# Patient Record
Sex: Female | Born: 1935 | Race: White | Hispanic: No | Smoking: Never smoker
Health system: Southern US, Community
[De-identification: ages and names within clinical notes are randomized; demographics above are authoritative.]

## PROBLEM LIST (undated history)

## (undated) DIAGNOSIS — F329 Major depressive disorder, single episode, unspecified: Secondary | ICD-10-CM

## (undated) DIAGNOSIS — R1310 Dysphagia, unspecified: Secondary | ICD-10-CM

## (undated) DIAGNOSIS — E785 Hyperlipidemia, unspecified: Secondary | ICD-10-CM

## (undated) DIAGNOSIS — K219 Gastro-esophageal reflux disease without esophagitis: Secondary | ICD-10-CM

## (undated) DIAGNOSIS — R918 Other nonspecific abnormal finding of lung field: Secondary | ICD-10-CM

## (undated) DIAGNOSIS — I10 Essential (primary) hypertension: Secondary | ICD-10-CM

## (undated) DIAGNOSIS — F32A Depression, unspecified: Secondary | ICD-10-CM

## (undated) DIAGNOSIS — F419 Anxiety disorder, unspecified: Secondary | ICD-10-CM

## (undated) HISTORY — DX: Gastro-esophageal reflux disease without esophagitis: K21.9

## (undated) HISTORY — PX: COLONOSCOPY: SHX174

## (undated) HISTORY — PX: LUMBAR LAMINECTOMY: SHX95

## (undated) HISTORY — DX: Hyperlipidemia, unspecified: E78.5

## (undated) HISTORY — DX: Major depressive disorder, single episode, unspecified: F32.9

## (undated) HISTORY — DX: Anxiety disorder, unspecified: F41.9

## (undated) HISTORY — PX: APPENDECTOMY: SHX54

## (undated) HISTORY — DX: Depression, unspecified: F32.A

## (undated) HISTORY — DX: Essential (primary) hypertension: I10

---

## 2001-04-03 ENCOUNTER — Encounter: Admission: RE | Admit: 2001-04-03 | Discharge: 2001-04-03 | Payer: Self-pay | Admitting: Family Medicine

## 2001-04-03 ENCOUNTER — Encounter: Payer: Self-pay | Admitting: Family Medicine

## 2001-05-29 ENCOUNTER — Other Ambulatory Visit: Admission: RE | Admit: 2001-05-29 | Discharge: 2001-05-29 | Payer: Self-pay | Admitting: Family Medicine

## 2002-03-05 ENCOUNTER — Encounter: Admission: RE | Admit: 2002-03-05 | Discharge: 2002-04-09 | Payer: Self-pay | Admitting: Orthopedic Surgery

## 2002-09-05 ENCOUNTER — Emergency Department (HOSPITAL_COMMUNITY): Admission: EM | Admit: 2002-09-05 | Discharge: 2002-09-05 | Payer: Self-pay | Admitting: Emergency Medicine

## 2002-09-05 ENCOUNTER — Encounter: Payer: Self-pay | Admitting: Emergency Medicine

## 2002-09-15 ENCOUNTER — Emergency Department (HOSPITAL_COMMUNITY): Admission: EM | Admit: 2002-09-15 | Discharge: 2002-09-15 | Payer: Self-pay | Admitting: Emergency Medicine

## 2002-09-15 ENCOUNTER — Encounter: Payer: Self-pay | Admitting: Emergency Medicine

## 2003-02-20 ENCOUNTER — Inpatient Hospital Stay (HOSPITAL_COMMUNITY): Admission: RE | Admit: 2003-02-20 | Discharge: 2003-02-22 | Payer: Self-pay | Admitting: Orthopedic Surgery

## 2003-02-25 ENCOUNTER — Emergency Department (HOSPITAL_COMMUNITY): Admission: EM | Admit: 2003-02-25 | Discharge: 2003-02-25 | Payer: Self-pay | Admitting: Emergency Medicine

## 2003-05-19 ENCOUNTER — Encounter: Admission: RE | Admit: 2003-05-19 | Discharge: 2003-06-30 | Payer: Self-pay | Admitting: Orthopedic Surgery

## 2004-07-08 ENCOUNTER — Other Ambulatory Visit: Admission: RE | Admit: 2004-07-08 | Discharge: 2004-07-08 | Payer: Self-pay | Admitting: Family Medicine

## 2004-08-30 ENCOUNTER — Ambulatory Visit (HOSPITAL_COMMUNITY): Admission: RE | Admit: 2004-08-30 | Discharge: 2004-08-30 | Payer: Self-pay | Admitting: Gastroenterology

## 2005-07-19 ENCOUNTER — Ambulatory Visit: Payer: Self-pay | Admitting: Family Medicine

## 2005-10-25 ENCOUNTER — Ambulatory Visit: Payer: Self-pay | Admitting: Family Medicine

## 2005-11-14 ENCOUNTER — Ambulatory Visit: Payer: Self-pay | Admitting: Cardiology

## 2005-11-30 ENCOUNTER — Ambulatory Visit: Payer: Self-pay | Admitting: Family Medicine

## 2006-01-19 ENCOUNTER — Ambulatory Visit: Payer: Self-pay | Admitting: Family Medicine

## 2006-06-23 DIAGNOSIS — M810 Age-related osteoporosis without current pathological fracture: Secondary | ICD-10-CM | POA: Insufficient documentation

## 2006-06-23 DIAGNOSIS — F411 Generalized anxiety disorder: Secondary | ICD-10-CM | POA: Insufficient documentation

## 2006-06-23 DIAGNOSIS — Z8679 Personal history of other diseases of the circulatory system: Secondary | ICD-10-CM | POA: Insufficient documentation

## 2006-06-23 DIAGNOSIS — F329 Major depressive disorder, single episode, unspecified: Secondary | ICD-10-CM

## 2006-06-27 ENCOUNTER — Ambulatory Visit: Payer: Self-pay | Admitting: Family Medicine

## 2006-06-27 LAB — CONVERTED CEMR LAB
AST: 28 units/L (ref 0–37)
Albumin: 4 g/dL (ref 3.5–5.2)
Alkaline Phosphatase: 40 units/L (ref 39–117)
CO2: 31 meq/L (ref 19–32)
Chloride: 100 meq/L (ref 96–112)
Creatinine, Ser: 0.7 mg/dL (ref 0.4–1.2)
Potassium: 4.3 meq/L (ref 3.5–5.1)
Sodium: 136 meq/L (ref 135–145)
Total Bilirubin: 0.6 mg/dL (ref 0.3–1.2)
Total Protein: 7 g/dL (ref 6.0–8.3)

## 2006-08-17 ENCOUNTER — Ambulatory Visit: Payer: Self-pay | Admitting: Family Medicine

## 2006-08-17 DIAGNOSIS — H6590 Unspecified nonsuppurative otitis media, unspecified ear: Secondary | ICD-10-CM | POA: Insufficient documentation

## 2006-08-28 ENCOUNTER — Encounter: Payer: Self-pay | Admitting: Family Medicine

## 2006-09-02 ENCOUNTER — Encounter: Payer: Self-pay | Admitting: Family Medicine

## 2006-09-04 ENCOUNTER — Encounter (INDEPENDENT_AMBULATORY_CARE_PROVIDER_SITE_OTHER): Payer: Self-pay | Admitting: *Deleted

## 2006-09-07 ENCOUNTER — Encounter: Payer: Self-pay | Admitting: Family Medicine

## 2006-09-12 ENCOUNTER — Telehealth (INDEPENDENT_AMBULATORY_CARE_PROVIDER_SITE_OTHER): Payer: Self-pay | Admitting: *Deleted

## 2006-09-18 ENCOUNTER — Telehealth (INDEPENDENT_AMBULATORY_CARE_PROVIDER_SITE_OTHER): Payer: Self-pay | Admitting: *Deleted

## 2006-09-25 ENCOUNTER — Other Ambulatory Visit: Admission: RE | Admit: 2006-09-25 | Discharge: 2006-09-25 | Payer: Self-pay | Admitting: Family Medicine

## 2006-09-25 ENCOUNTER — Encounter: Payer: Self-pay | Admitting: Family Medicine

## 2006-09-25 ENCOUNTER — Ambulatory Visit: Payer: Self-pay | Admitting: Family Medicine

## 2006-09-25 DIAGNOSIS — E785 Hyperlipidemia, unspecified: Secondary | ICD-10-CM

## 2006-09-25 DIAGNOSIS — H9190 Unspecified hearing loss, unspecified ear: Secondary | ICD-10-CM | POA: Insufficient documentation

## 2006-09-26 ENCOUNTER — Encounter: Payer: Self-pay | Admitting: Family Medicine

## 2006-09-28 ENCOUNTER — Encounter (INDEPENDENT_AMBULATORY_CARE_PROVIDER_SITE_OTHER): Payer: Self-pay | Admitting: *Deleted

## 2006-10-12 ENCOUNTER — Telehealth: Payer: Self-pay | Admitting: Family Medicine

## 2006-11-21 ENCOUNTER — Encounter: Payer: Self-pay | Admitting: Family Medicine

## 2006-11-22 ENCOUNTER — Telehealth: Payer: Self-pay | Admitting: Family Medicine

## 2007-01-03 ENCOUNTER — Telehealth (INDEPENDENT_AMBULATORY_CARE_PROVIDER_SITE_OTHER): Payer: Self-pay | Admitting: *Deleted

## 2007-01-03 ENCOUNTER — Ambulatory Visit: Payer: Self-pay | Admitting: Family Medicine

## 2007-01-25 ENCOUNTER — Telehealth (INDEPENDENT_AMBULATORY_CARE_PROVIDER_SITE_OTHER): Payer: Self-pay | Admitting: *Deleted

## 2007-04-19 ENCOUNTER — Telehealth (INDEPENDENT_AMBULATORY_CARE_PROVIDER_SITE_OTHER): Payer: Self-pay | Admitting: *Deleted

## 2007-06-08 ENCOUNTER — Telehealth (INDEPENDENT_AMBULATORY_CARE_PROVIDER_SITE_OTHER): Payer: Self-pay | Admitting: *Deleted

## 2007-06-14 ENCOUNTER — Ambulatory Visit: Payer: Self-pay | Admitting: Family Medicine

## 2007-06-14 DIAGNOSIS — I10 Essential (primary) hypertension: Secondary | ICD-10-CM | POA: Insufficient documentation

## 2007-06-14 DIAGNOSIS — M1990 Unspecified osteoarthritis, unspecified site: Secondary | ICD-10-CM | POA: Insufficient documentation

## 2007-06-15 ENCOUNTER — Encounter (INDEPENDENT_AMBULATORY_CARE_PROVIDER_SITE_OTHER): Payer: Self-pay | Admitting: *Deleted

## 2007-06-19 ENCOUNTER — Encounter (INDEPENDENT_AMBULATORY_CARE_PROVIDER_SITE_OTHER): Payer: Self-pay | Admitting: *Deleted

## 2007-08-14 ENCOUNTER — Encounter (INDEPENDENT_AMBULATORY_CARE_PROVIDER_SITE_OTHER): Payer: Self-pay | Admitting: *Deleted

## 2007-08-16 ENCOUNTER — Telehealth: Payer: Self-pay | Admitting: Family Medicine

## 2007-08-30 ENCOUNTER — Telehealth (INDEPENDENT_AMBULATORY_CARE_PROVIDER_SITE_OTHER): Payer: Self-pay | Admitting: *Deleted

## 2007-12-11 ENCOUNTER — Ambulatory Visit: Payer: Self-pay | Admitting: Family Medicine

## 2008-01-11 ENCOUNTER — Encounter: Payer: Self-pay | Admitting: Family Medicine

## 2008-01-25 ENCOUNTER — Ambulatory Visit: Payer: Self-pay | Admitting: Family Medicine

## 2008-02-04 ENCOUNTER — Encounter (INDEPENDENT_AMBULATORY_CARE_PROVIDER_SITE_OTHER): Payer: Self-pay | Admitting: *Deleted

## 2008-02-05 ENCOUNTER — Telehealth (INDEPENDENT_AMBULATORY_CARE_PROVIDER_SITE_OTHER): Payer: Self-pay | Admitting: *Deleted

## 2008-02-07 ENCOUNTER — Ambulatory Visit: Payer: Self-pay | Admitting: Family Medicine

## 2008-02-11 ENCOUNTER — Encounter (INDEPENDENT_AMBULATORY_CARE_PROVIDER_SITE_OTHER): Payer: Self-pay | Admitting: *Deleted

## 2008-02-20 ENCOUNTER — Encounter (INDEPENDENT_AMBULATORY_CARE_PROVIDER_SITE_OTHER): Payer: Self-pay | Admitting: *Deleted

## 2008-03-20 ENCOUNTER — Telehealth (INDEPENDENT_AMBULATORY_CARE_PROVIDER_SITE_OTHER): Payer: Self-pay | Admitting: *Deleted

## 2008-05-21 ENCOUNTER — Encounter: Payer: Self-pay | Admitting: Family Medicine

## 2008-05-28 ENCOUNTER — Encounter: Payer: Self-pay | Admitting: Family Medicine

## 2008-07-14 ENCOUNTER — Ambulatory Visit: Payer: Self-pay | Admitting: Family Medicine

## 2008-07-14 DIAGNOSIS — J069 Acute upper respiratory infection, unspecified: Secondary | ICD-10-CM | POA: Insufficient documentation

## 2008-12-04 ENCOUNTER — Encounter: Payer: Self-pay | Admitting: Family Medicine

## 2009-01-12 ENCOUNTER — Encounter: Payer: Self-pay | Admitting: Family Medicine

## 2009-01-26 ENCOUNTER — Ambulatory Visit: Payer: Self-pay | Admitting: Family Medicine

## 2009-01-26 DIAGNOSIS — R5383 Other fatigue: Secondary | ICD-10-CM

## 2009-01-26 DIAGNOSIS — R5381 Other malaise: Secondary | ICD-10-CM

## 2009-01-27 ENCOUNTER — Telehealth: Payer: Self-pay | Admitting: Family Medicine

## 2009-01-28 ENCOUNTER — Encounter (INDEPENDENT_AMBULATORY_CARE_PROVIDER_SITE_OTHER): Payer: Self-pay | Admitting: *Deleted

## 2009-01-30 ENCOUNTER — Telehealth (INDEPENDENT_AMBULATORY_CARE_PROVIDER_SITE_OTHER): Payer: Self-pay | Admitting: *Deleted

## 2009-02-17 ENCOUNTER — Telehealth (INDEPENDENT_AMBULATORY_CARE_PROVIDER_SITE_OTHER): Payer: Self-pay | Admitting: *Deleted

## 2009-03-11 ENCOUNTER — Ambulatory Visit: Payer: Self-pay | Admitting: Family Medicine

## 2009-05-20 ENCOUNTER — Telehealth: Payer: Self-pay | Admitting: Family Medicine

## 2009-05-27 ENCOUNTER — Ambulatory Visit: Payer: Self-pay | Admitting: Family Medicine

## 2009-06-09 ENCOUNTER — Telehealth (INDEPENDENT_AMBULATORY_CARE_PROVIDER_SITE_OTHER): Payer: Self-pay | Admitting: *Deleted

## 2009-06-11 ENCOUNTER — Encounter: Payer: Self-pay | Admitting: Family Medicine

## 2009-06-15 ENCOUNTER — Telehealth (INDEPENDENT_AMBULATORY_CARE_PROVIDER_SITE_OTHER): Payer: Self-pay | Admitting: *Deleted

## 2009-09-07 ENCOUNTER — Ambulatory Visit: Payer: Self-pay | Admitting: Family Medicine

## 2009-10-14 ENCOUNTER — Telehealth: Payer: Self-pay | Admitting: Family Medicine

## 2009-12-08 ENCOUNTER — Ambulatory Visit: Payer: Self-pay | Admitting: Family Medicine

## 2010-01-07 ENCOUNTER — Telehealth: Payer: Self-pay | Admitting: Family Medicine

## 2010-01-13 ENCOUNTER — Encounter: Payer: Self-pay | Admitting: Family Medicine

## 2010-01-29 ENCOUNTER — Encounter: Payer: Self-pay | Admitting: Family Medicine

## 2010-01-29 ENCOUNTER — Ambulatory Visit: Payer: Self-pay | Admitting: Family Medicine

## 2010-01-29 LAB — CONVERTED CEMR LAB
Bilirubin Urine: NEGATIVE
Ketones, urine, test strip: NEGATIVE
Nitrite: NEGATIVE
Specific Gravity, Urine: <1.005

## 2010-02-01 LAB — CONVERTED CEMR LAB
ALT: 24 units/L (ref 0–35)
Albumin: 4.4 g/dL (ref 3.5–5.2)
Basophils Relative: 0.8 % (ref 0.0–3.0)
CO2: 28 meq/L (ref 19–32)
Chloride: 100 meq/L (ref 96–112)
Creatinine, Ser: 0.7 mg/dL (ref 0.4–1.2)
Eosinophils Absolute: 0.4 10*3/uL (ref 0.0–0.7)
HCT: 38 % (ref 36.0–46.0)
Hemoglobin: 13.2 g/dL (ref 12.0–15.0)
Lymphs Abs: 2 10*3/uL (ref 0.7–4.0)
MCHC: 34.8 g/dL (ref 30.0–36.0)
MCV: 96 fL (ref 78.0–100.0)
Monocytes Absolute: 0.6 10*3/uL (ref 0.1–1.0)
Neutro Abs: 2.7 10*3/uL (ref 1.4–7.7)
Neutrophils Relative %: 47 % (ref 43.0–77.0)
Potassium: 4.7 meq/L (ref 3.5–5.1)
RBC: 3.96 M/uL (ref 3.87–5.11)
Total CHOL/HDL Ratio: 3
Total Protein: 7.2 g/dL (ref 6.0–8.3)
Triglycerides: 130 mg/dL (ref 0.0–149.0)
Vit D, 25-Hydroxy: 60 ng/mL (ref 30–89)

## 2010-02-05 ENCOUNTER — Telehealth: Payer: Self-pay | Admitting: Family Medicine

## 2010-02-19 ENCOUNTER — Encounter: Payer: Self-pay | Admitting: Family Medicine

## 2010-02-25 ENCOUNTER — Encounter (INDEPENDENT_AMBULATORY_CARE_PROVIDER_SITE_OTHER): Payer: Self-pay | Admitting: *Deleted

## 2010-03-05 ENCOUNTER — Telehealth: Payer: Self-pay | Admitting: Family Medicine

## 2010-03-11 ENCOUNTER — Ambulatory Visit: Admit: 2010-03-11 | Payer: Self-pay | Admitting: Family Medicine

## 2010-03-21 LAB — CONVERTED CEMR LAB
ALT: 17 units/L (ref 0–35)
ALT: 24 units/L (ref 0–35)
ALT: 24 units/L (ref 0–35)
ALT: 25 units/L (ref 0–35)
AST: 25 units/L (ref 0–37)
AST: 25 units/L (ref 0–37)
AST: 29 units/L (ref 0–37)
Albumin: 3.9 g/dL (ref 3.5–5.2)
Albumin: 4 g/dL (ref 3.5–5.2)
Albumin: 4.1 g/dL (ref 3.5–5.2)
Albumin: 4.3 g/dL (ref 3.5–5.2)
Albumin: 4.5 g/dL (ref 3.5–5.2)
Alkaline Phosphatase: 43 units/L (ref 39–117)
Alkaline Phosphatase: 43 units/L (ref 39–117)
Alkaline Phosphatase: 53 units/L (ref 39–117)
BUN: 9 mg/dL (ref 6–23)
Basophils Absolute: 0 10*3/uL (ref 0.0–0.1)
Basophils Relative: 0.5 % (ref 0.0–1.0)
Basophils Relative: 0.5 % (ref 0.0–3.0)
Basophils Relative: 0.6 % (ref 0.0–3.0)
Bilirubin Urine: NEGATIVE
Bilirubin, Direct: 0 mg/dL (ref 0.0–0.3)
Bilirubin, Direct: 0.1 mg/dL (ref 0.0–0.3)
Bilirubin, Direct: 0.1 mg/dL (ref 0.0–0.3)
CO2: 29 meq/L (ref 19–32)
CO2: 29 meq/L (ref 19–32)
CO2: 30 meq/L (ref 19–32)
Calcium: 8.6 mg/dL (ref 8.4–10.5)
Calcium: 9.1 mg/dL (ref 8.4–10.5)
Calcium: 9.2 mg/dL (ref 8.4–10.5)
Calcium: 9.3 mg/dL (ref 8.4–10.5)
Chloride: 102 meq/L (ref 96–112)
Chloride: 98 meq/L (ref 96–112)
Creatinine, Ser: 0.5 mg/dL (ref 0.4–1.2)
Creatinine, Ser: 0.6 mg/dL (ref 0.4–1.2)
Eosinophils Absolute: 0.2 10*3/uL (ref 0.0–0.7)
GFR calc Af Amer: 125 mL/min
GFR calc non Af Amer: 104 mL/min
Glucose, Bld: 73 mg/dL (ref 70–99)
Glucose, Bld: 84 mg/dL (ref 70–99)
Glucose, Urine, Semiquant: NEGATIVE
HCT: 37.8 % (ref 36.0–46.0)
Hemoglobin: 12.3 g/dL (ref 12.0–15.0)
Hemoglobin: 13.2 g/dL (ref 12.0–15.0)
Hemoglobin: 13.4 g/dL (ref 12.0–15.0)
Lymphocytes Relative: 31.7 % (ref 12.0–46.0)
Lymphocytes Relative: 34.2 % (ref 12.0–46.0)
MCHC: 33.7 g/dL (ref 30.0–36.0)
MCHC: 34.8 g/dL (ref 30.0–36.0)
MCHC: 34.9 g/dL (ref 30.0–36.0)
MCV: 99.1 fL (ref 78.0–100.0)
Monocytes Absolute: 0.5 10*3/uL (ref 0.2–0.7)
Monocytes Absolute: 0.6 10*3/uL (ref 0.1–1.0)
Monocytes Relative: 10.1 % (ref 3.0–12.0)
Monocytes Relative: 9 % (ref 3.0–11.0)
Neutro Abs: 3.1 10*3/uL (ref 1.4–7.7)
Neutro Abs: 3.2 10*3/uL (ref 1.4–7.7)
Potassium: 3.6 meq/L (ref 3.5–5.1)
Potassium: 4.8 meq/L (ref 3.5–5.1)
Protein, U semiquant: NEGATIVE
RBC: 3.76 M/uL — ABNORMAL LOW (ref 3.87–5.11)
RBC: 3.95 M/uL (ref 3.87–5.11)
RBC: 4.02 M/uL (ref 3.87–5.11)
RDW: 12.4 % (ref 11.5–14.6)
Sed Rate: 18 mm/hr (ref 0–22)
Sodium: 133 meq/L — ABNORMAL LOW (ref 135–145)
Sodium: 134 meq/L — ABNORMAL LOW (ref 135–145)
Specific Gravity, Urine: <1.005
TSH: 2.21 microintl units/mL (ref 0.35–5.50)
Total Bilirubin: 0.4 mg/dL (ref 0.3–1.2)
Total Bilirubin: 0.6 mg/dL (ref 0.3–1.2)
Total Bilirubin: 0.7 mg/dL (ref 0.3–1.2)
Total CHOL/HDL Ratio: 5
Total Protein: 6.9 g/dL (ref 6.0–8.3)
Total Protein: 7.1 g/dL (ref 6.0–8.3)
Total Protein: 7.2 g/dL (ref 6.0–8.3)
Total Protein: 7.3 g/dL (ref 6.0–8.3)
Total Protein: 7.3 g/dL (ref 6.0–8.3)
Total Protein: 7.5 g/dL (ref 6.0–8.3)
Urobilinogen, UA: NEGATIVE
Vit D, 1,25-Dihydroxy: 58 (ref 30–89)
WBC Urine, dipstick: NEGATIVE
pH: 8.5

## 2010-03-24 ENCOUNTER — Encounter: Payer: Self-pay | Admitting: Family Medicine

## 2010-03-24 ENCOUNTER — Ambulatory Visit (INDEPENDENT_AMBULATORY_CARE_PROVIDER_SITE_OTHER): Payer: BC Managed Care – PPO | Admitting: Family Medicine

## 2010-03-24 ENCOUNTER — Ambulatory Visit: Admit: 2010-03-24 | Payer: Self-pay | Admitting: Family Medicine

## 2010-03-24 DIAGNOSIS — M810 Age-related osteoporosis without current pathological fracture: Secondary | ICD-10-CM

## 2010-03-25 NOTE — Progress Notes (Signed)
Summary: never started med  Phone Note Call from Patient   Caller: Patient Summary of Call:  Pt states that after reading labs results she realized that she never started taking LIVALO 2mg  due to the cost. pt states that she has currently been taking pravastatin 80mg  once daily. pt would like to know if she needs to continue. with this med or change. However pt states that this is the lowest her cholesterol has been and would like to continue with this med........................Marland KitchenFelecia Deloach CMA  June 15, 2009 8:41 AM   Follow-up for Phone Call        ok to con't pravachol-- but needs something to lower TG and SMall LDL----  We can try to increase Fish Oil / Flaxseed oil to 4 g daily divided---do not take it all at once---and recheck 3 months Follow-up by: Loreen Freud DO,  June 15, 2009 12:54 PM  Additional Follow-up for Phone Call Additional follow up Details #1::        left message to call office..............Marland KitchenFelecia Deloach CMA  June 15, 2009 4:08 PM   pt aware rx sent to pharmacy. pt  will try flaxseed oil due to acid reflux unable to tolerate fish oil..........Marland KitchenFelecia Deloach CMA  June 15, 2009 4:43 PM     New/Updated Medications: PRAVACHOL 80 MG TABS (PRAVASTATIN SODIUM) take 1 tab once daily Prescriptions: PRAVACHOL 80 MG TABS (PRAVASTATIN SODIUM) take 1 tab once daily  #90 x 0   Entered by:   Jeremy Johann CMA   Authorized by:   Loreen Freud DO   Signed by:   Jeremy Johann CMA on 06/15/2009   Method used:   Faxed to ...       [facility] (mail-order)             ,          Ph: [phone]       Fax: [phone]   RxID:   [id number]

## 2010-03-25 NOTE — Assessment & Plan Note (Signed)
Summary: EMP//PH   Vital Signs:  Patient profile:   77 year old female Height:      59 inches Weight:      112.6 pounds BMI:     22.82 Temp:     97.3 degrees F oral BP sitting:   120 / 80  (right arm) Cuff size:   regular  Vitals Entered By: Almeta Monas CMA Duncan Dull) (January 29, 2010 10:00 AM) CC: CPX/fasting- c/o neck   Does patient need assistance? Functional Status Self care, Cook/clean, Shopping, Social activities Ambulation Normal Comments pt is able todo all ADLS and can read and write  Vision Screening:      Vision Comments: optho--regularly--1-2 x a year--- Dr Elmer Picker 40db HL: Left  Right  Audiometry Comment: pt has hearing aids.    History of Present Illness: Pt here for cpe and labs--- no pap.   dr Gwendolyn Grant-- dentist optho--Dr Elmer Picker  Preventive Screening-Counseling & Management  Alcohol-Tobacco     Alcohol drinks/day: 0     Smoking Status: never     Passive Smoke Exposure: no  Caffeine-Diet-Exercise     Caffeine use/day: 0     Does Patient Exercise: yes     Type of exercise: walk      Exercise (avg: min/session): <30     Times/week: 3  Hep-HIV-STD-Contraception     HIV Risk: no     Dental Visit-last 6 months yes     Dental Care Counseling: not indicated; dental care within six months     SBE monthly: no     SBE Education/Counseling: to perform regular SBE  Safety-Violence-Falls     Seat Belt Use: 100     Firearms in the Home: no firearms in the home     Smoke Detectors: yes     Violence in the Home: no risk noted     Sexual Abuse: no     Fall Risk: no  Current Medications (verified): 1)  Xanax 0.5 Mg Tabs (Alprazolam) .... Take One Tablet Daily 2)  Impramine 75 Mg .Marland Kitchen.. 1 Once Daily 3)  Atenolol 25 Mg  Tabs (Atenolol) .Marland Kitchen.. 1 By Mouth Once Daily 4)  Ambien 10 Mg  Tabs (Zolpidem Tartrate) .... 1/2 Tab 3 X Every Week 5)  Fish Oil 1200 Mg  Caps (Omega-3 Fatty Acids) .Marland Kitchen.. 1 Once Daily 6)  Calcium .Marland KitchenMarland Kitchen. 1800 Mg Dailky 7)  Adult Aspirin Low  Strength 81 Mg  Tbdp (Aspirin) .Marland Kitchen.. 1 By Mouth Once Daily 8)  Zostavax 95621 Unt/0.64ml Solr (Zoster Vaccine Live) .Marland Kitchen.. 1 Ml Im X1 9)  Claritin 10 Mg Tabs (Loratadine) .Marland Kitchen.. 1 By Mouth Once Daily 10)  Vitamin D3 1000 Unit Caps (Cholecalciferol) .... 3 A Day 11)  Bd Insulin Syringe Ultrafine 29g X 1/2" 0.3 Ml Misc (Insulin Syringe-Needle U-100) .... As Directed 12)  Celebrex 200 Mg Caps (Celecoxib) .Marland Kitchen.. 1 By Mouth Once Daily 13)  Prilosec Otc 20 Mg Tbec (Omeprazole Magnesium) .Marland Kitchen.. 1 By Mouth Once Daily 14)  Lipitor 20 Mg Tabs (Atorvastatin Calcium) .... Take 1 Tab Once Daily 15)  Boostrix 5-2.5-18.5 Susp (Tetanus-Diphth-Acell Pertussis) .... As Directed X1 16)  Mobic 15 Mg Tabs (Meloxicam) .... 1/2 -1 By Mouth Once Daily As Needed  Allergies (verified): 1)  ! Codeine 2)  ! Demerol 3)  ! Vicodin 4)  ! Clindamycin 5)  ! Cephalexin 6)  ! 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 Family History Breast cancer 1st degree relative <50 Family  History Ovarian cancer Family History of CAD Female 1st degree relative <60 Family History of Alcoholism/Addiction Family History Depression Family History of Arthritis f- silicosis m- osteopososis, Alz    Social History: Reviewed history from 09/25/2006 and no changes required. 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   General:  Denies chills, fatigue, fever, loss of appetite, malaise, sleep disorder, sweats, weakness, and weight loss. Eyes:  Denies blurring, discharge, double vision, eye irritation, eye pain, halos, itching, light sensitivity, red eye, vision loss-1 eye, and vision loss-both eyes. ENT:  Complains of decreased hearing; denies difficulty swallowing, ear discharge, earache, hoarseness, nasal congestion, nosebleeds, postnasal drainage, ringing in ears, sinus pressure, and sore throat. CV:  Denies bluish discoloration of lips or nails, chest pain or discomfort, difficulty breathing at night, difficulty breathing while lying down, fainting, fatigue, leg cramps with exertion, lightheadness, near fainting, palpitations, shortness of breath with exertion, swelling of feet, swelling of hands, and weight gain. Resp:  Denies chest discomfort, chest pain with inspiration, cough, coughing up blood, excessive snoring, hypersomnolence, morning headaches, pleuritic, shortness of breath, sputum productive, and wheezing. GI:  Denies abdominal pain, bloody stools, change in bowel habits, constipation, dark tarry stools, diarrhea, excessive appetite, gas, hemorrhoids, indigestion, loss of appetite, nausea, vomiting, vomiting blood, and yellowish skin color. GU:  Denies abnormal vaginal bleeding, decreased libido, discharge, dysuria, genital sores, hematuria, incontinence, nocturia, urinary  frequency, and urinary hesitancy. MS:  Denies joint pain, joint redness, joint swelling, loss of strength, low back pain, mid back pain, muscle aches, muscle , cramps, muscle weakness, stiffness, and thoracic pain. Derm:  Denies changes in color of skin, changes in nail beds, dryness, excessive perspiration, flushing, hair loss, insect bite(s), itching, lesion(s), poor wound healing, and rash. Neuro:  Denies brief paralysis, difficulty with concentration, disturbances in coordination, falling down, headaches, inability to speak, memory loss, numbness, poor balance, seizures, sensation of room spinning, tingling, tremors, visual disturbances, and weakness. Psych:  Denies alternate hallucination ( auditory/visual), anxiety, depression, easily angered, easily tearful, irritability, mental problems, panic attacks, sense of great danger, suicidal thoughts/plans, thoughts of violence, unusual visions or sounds, and thoughts /plans of harming others. Endo:  Denies cold intolerance, excessive hunger, excessive thirst, excessive urination, heat intolerance, polyuria, and weight change. Heme:  Denies abnormal bruising, bleeding, enlarge lymph nodes, fevers, pallor, and skin discoloration. Allergy:  Denies hives or rash, itching eyes, persistent infections, seasonal allergies, and sneezing.  Physical Exam  General:  Well-developed,well-nourished,in no acute distress; alert,appropriate and cooperative throughout examination Head:  Normocephalic and  atraumatic without obvious abnormalities. No apparent alopecia or balding. Eyes:  pupils equal, pupils round, pupils reactive to light, and no injection.   Ears:  External ear exam shows no significant lesions or deformities.  Otoscopic examination reveals clear canals, tympanic membranes are intact bilaterally without bulging, retraction, inflammation or discharge. .R decreased hearing and L decreased hearing.   Nose:  External nasal examination shows no deformity or  inflammation. Nasal mucosa are pink and moist without lesions or exudates. Mouth:  Oral mucosa and oropharynx without lesions or exudates.  Teeth in good repair. Neck:  No deformities, masses, or tenderness noted. Chest Wall:  No deformities, masses, or tenderness noted. Breasts:  No mass, nodules, thickening, tenderness, bulging, retraction, inflamation, nipple discharge or skin changes noted.   Lungs:  Normal respiratory effort, chest expands symmetrically. Lungs are clear to auscultation, no crackles or wheezes. Heart:  normal rate and no murmur.   Abdomen:  Bowel sounds positive,abdomen soft and non-tender without masses, organomegaly or hernias noted. Msk:  normal ROM, no joint tenderness, no joint swelling, no joint warmth, no redness over joints, no joint deformities, no joint instability, no crepitation, and no muscle atrophy.   Pulses:  R and L carotid,radial,femoral,dorsalis pedis and posterior tibial pulses are full and equal bilaterally Extremities:  No clubbing, cyanosis, edema, or deformity noted with normal full range of motion of all joints.   Neurologic:  No cranial nerve deficits noted. Station and gait are normal. Plantar reflexes are down-going bilaterally. DTRs are symmetrical throughout. Sensory, motor and coordinative functions appear intact. Skin:  Intact without suspicious lesions or rashes Cervical Nodes:  No lymphadenopathy noted Axillary Nodes:  No palpable lymphadenopathy Psych:  Cognition and judgment appear intact. Alert and cooperative with normal attention span and concentration. No apparent delusions, illusions, hallucinations   Impression & Recommendations:  Problem # 1:  PREVENTIVE HEALTH CARE (ICD-V70.0)  Orders: Venipuncture ([address]) TLB-Lipid Panel (80061-LIPID) TLB-BMP (Basic Metabolic Panel-BMET) (80048-METABOL) TLB-CBC Platelet - w/Differential (85025-CBCD) TLB-Hepatic/Liver Function Pnl (80076-HEPATIC) T-Vitamin D (25-Hydroxy) (60454-09811) UA  Dipstick W/ Micro (manual) (91478) Medicare -1st Annual Wellness Visit [phone])  Problem # 2:  HYPERTENSION (ICD-401.9)  Her updated medication list for this problem includes:    Atenolol 25 Mg Tabs (Atenolol) .Marland Kitchen... 1 by mouth once daily  Orders: Venipuncture (13086) TLB-Lipid Panel (80061-LIPID) TLB-BMP (Basic Metabolic Panel-BMET) (80048-METABOL) TLB-CBC Platelet - w/Differential (85025-CBCD) TLB-Hepatic/Liver Function Pnl (80076-HEPATIC) T-Vitamin D (25-Hydroxy) (57846-96295)  BP today: 120/80 Prior BP: 122/80 (03/11/2009)  Labs Reviewed: K+: 3.6 (01/26/2009) Creat: : 0.7 (01/26/2009)   Chol: 294 (01/26/2009)   HDL: 57.90 (01/26/2009)   TG: 129.0 (01/26/2009)  Problem # 3:  LOC OSTEOARTHROS NOT SPEC PRIM/SEC UNSPEC SITE (ICD-715.30)  Her updated medication list for this problem includes:    Adult Aspirin Low Strength 81 Mg Tbdp (Aspirin) .Marland Kitchen... 1 by mouth once daily    Celebrex 200 Mg Caps (Celecoxib) .Marland Kitchen... 1 by mouth once daily    Mobic 15 Mg Tabs (Meloxicam) .Marland Kitchen... 1/2 -1 by mouth once daily as needed  Discussed use of medications, application of heat or cold, and exercises.   Problem # 4:  LOSS, HEARING NOS (ICD-389.9)  Problem # 5:  HYPERLIPIDEMIA (ICD-272.4)  Her updated medication list for this problem includes:    Lipitor 20 Mg Tabs (Atorvastatin calcium) .Marland Kitchen... Take 1 tab once daily  Orders: Venipuncture (28413) TLB-Lipid Panel (80061-LIPID) TLB-BMP (Basic Metabolic Panel-BMET) (80048-METABOL) TLB-CBC Platelet - w/Differential (85025-CBCD) TLB-Hepatic/Liver Function Pnl (80076-HEPATIC) T-Vitamin D (25-Hydroxy) (24401-02725)  Labs Reviewed: SGOT:  24 (09/07/2009)   SGPT: 24 (09/07/2009)   HDL:57.90 (01/26/2009)  Chol:294 (01/26/2009)  Trig:129.0 (01/26/2009)  Problem # 6:  OSTEOPOROSIS (ICD-733.00)  Her updated medication list for this problem includes:    Vitamin D3 1000 Unit Caps (Cholecalciferol) .Marland KitchenMarland KitchenMarland KitchenMarland Kitchen 3 a day  Orders: Venipuncture (91478) TLB-Lipid  Panel (80061-LIPID) TLB-BMP (Basic Metabolic Panel-BMET) (80048-METABOL) TLB-CBC Platelet - w/Differential (85025-CBCD) TLB-Hepatic/Liver Function Pnl (80076-HEPATIC) T-Vitamin D (25-Hydroxy) (29562-13086)  Bone Density: 0steoporosis (01/12/2009) Vit D:58 (01/26/2009), 58 (02/07/2008)  Complete Medication List: 1)  Xanax 0.5 Mg Tabs (Alprazolam) .... Take one tablet daily 2)  Impramine 75 Mg  .Marland Kitchen.. 1 once daily 3)  Atenolol 25 Mg Tabs (Atenolol) .Marland Kitchen.. 1 by mouth once daily 4)  Ambien 10 Mg Tabs (Zolpidem tartrate) .... 1/2 tab 3 x every week 5)  Fish Oil 1200 Mg Caps (Omega-3 fatty acids) .Marland Kitchen.. 1 once daily 6)  Calcium  .Marland KitchenMarland Kitchen. 1800 mg dailky 7)  Adult Aspirin Low Strength 81 Mg Tbdp (Aspirin) .Marland Kitchen.. 1 by mouth once daily 8)  Zostavax 57846 Unt/0.49ml Solr (Zoster vaccine live) .Marland Kitchen.. 1 ml im x1 9)  Claritin 10 Mg Tabs (Loratadine) .Marland Kitchen.. 1 by mouth once daily 10)  Vitamin D3 1000 Unit Caps (Cholecalciferol) .... 3 a day 11)  Bd Insulin Syringe Ultrafine 29g X 1/2" 0.3 Ml Misc (Insulin syringe-needle u-100) .... As directed 12)  Celebrex 200 Mg Caps (Celecoxib) .Marland Kitchen.. 1 by mouth once daily 13)  Prilosec Otc 20 Mg Tbec (Omeprazole magnesium) .Marland Kitchen.. 1 by mouth once daily 14)  Lipitor 20 Mg Tabs (Atorvastatin calcium) .... Take 1 tab once daily 15)  Boostrix 5-2.5-18.5 Susp (Tetanus-diphth-acell pertussis) .... As directed x1 16)  Mobic 15 Mg Tabs (Meloxicam) .... 1/2 -1 by mouth once daily as needed Prescriptions: MOBIC 15 MG TABS (MELOXICAM) 1/2 -1 by mouth once daily as needed  #30 x 01   Entered and Authorized by:   Loreen Freud DO   Signed by:   Loreen Freud DO on 01/29/2010   Method used:   Electronically to        [facility] (retail)       [address]       [place], [place]  [address]       Ph: [phone]       Fax: [phone]   RxID:   [id number] Prudy Feeler 0.5 MG TABS (ALPRAZOLAM) TAKE ONE TABLET DAILY  #30 x 0   Entered and Authorized by:   Loreen Freud DO   Signed by:   Loreen Freud DO on 01/29/2010   Method used:   Print then Give to Patient   RxID:   [id number] ATENOLOL 25 MG  TABS (ATENOLOL) 1 by mouth once daily  #90 x 3   Entered and Authorized by:   Loreen Freud DO   Signed by:   Loreen Freud DO on 01/29/2010   Method used:   Electronically to        [facility] (retail)             ,          Ph: [phone]       Fax: [phone]   RxID:   [id number] IMPRAMINE 75 MG 1 once daily  #270 x 3   Entered and Authorized by:   Loreen Freud DO   Signed by:   Loreen Freud DO on 01/29/2010   Method used:   Faxed to .[facility]  [facility] (retail)             ,          Ph: [phone]       Fax: [phone]   RxID:   [id number] YHCWCBJS 5-2.5-18.5 SUSP (TETANUS-DIPHTH-ACELL PERTUSSIS) as directed x1  #1 x 0   Entered and Authorized by:   Loreen Freud DO   Signed by:   Loreen Freud DO on 01/29/2010   Method used:   Print then Give to Patient   RxID:   [id number] ZOSTAVAX 19400 UNT/0.65ML SOLR (ZOSTER VACCINE LIVE) 1 ml IM x1  #1 x 0   Entered and Authorized by:   Loreen Freud DO   Signed by:   Loreen Freud DO on 01/29/2010   Method used:   Print then Give to Patient   RxID:   [id number]    Orders Added: 1)  Venipuncture [93818] 2)  TLB-Lipid Panel [80061-LIPID] 3)  TLB-BMP (Basic Metabolic Panel-BMET) [80048-METABOL] 4)  TLB-CBC Platelet - w/Differential [85025-CBCD] 5)  TLB-Hepatic/Liver Function Pnl [80076-HEPATIC] 6)  T-Vitamin D (25-Hydroxy) [29937-16967] 7)  UA Dipstick W/ Micro (manual) [81000] 8)  Medicare -1st Annual Wellness Visit [G0438] 9)  Est. Patient Level III [89381]    PAP Next Due:  Not Indicated Last Mammogram:  normal (01/12/2009 9:58:23 AM) Mammogram Result Date:  01/13/2010 Mammogram Result:  normal Mammogram Next Due:  1 yr  Laboratory Results   Urine Tests   Date/Time Reported: January 29, 2010 11:58 AM   Routine Urinalysis   Color:  yellow Appearance: Clear Glucose: negative   (Normal Range: Negative) Bilirubin: negative   (Normal Range: Negative) Ketone: negative   (Normal Range: Negative) Spec. Gravity: <1.005   (Normal Range: 1.003-1.035) Blood: negative   (Normal Range: Negative) pH: 7.0   (Normal Range: 5.0-8.0) Protein: negative   (Normal Range: Negative) Urobilinogen: negative   (Normal Range: 0-1) Nitrite: negative   (Normal Range: Negative) Leukocyte Esterace: negative   (Normal Range: Negative)    Comments: Floydene Flock  January 29, 2010 11:58 AM

## 2010-03-25 NOTE — Miscellaneous (Signed)
Summary: Immunization Entry   Immunization History:  Zostavax History:    Zostavax # 1:  zostavax (02/19/2010)

## 2010-03-25 NOTE — Progress Notes (Signed)
Summary: lab results  Phone Note Outgoing Call   Call placed by: [facility] CMA,  June 09, 2009 10:47 AM Summary of Call: left message to call office................Marland KitchenFelecia Deloach CMA  June 09, 2009 10:47 AM   Increase Livalo 4 mg  #30  1 by mouth at bedtime  2 refills  recheck 3 months---Nmr , hep  272.4   Follow-up for Phone Call        pt aware rx sent to pharmacy, labs mailed.................Marland KitchenFelecia Deloach CMA  June 09, 2009 11:11 AM     New/Updated Medications: LIVALO 4 MG TABS (PITAVASTATIN CALCIUM) Take 1 by mouth at bedtime Prescriptions: LIVALO 4 MG TABS (PITAVASTATIN CALCIUM) Take 1 by mouth at bedtime  #90 x 0   Entered by:   Jeremy Johann CMA   Authorized by:   Loreen Freud DO   Signed by:   Jeremy Johann CMA on 06/09/2009   Method used:   Faxed to ...       [facility] (mail-order)             ,          Ph: [phone]       Fax: [phone]   RxID:   [id number]

## 2010-03-25 NOTE — Progress Notes (Signed)
Summary: refill  Phone Note Refill Request   Refills Requested: Medication #1:  AMBIEN 10 MG  TABS 1/2 TAB 3 X EVERY WEEK   Last Refilled: 01/26/2009 last ov- 02/2009 for prolia. Pharm- [facility] on [address]  Initial call taken by: Army Fossa CMA,  May 20, 2009 2:27 PM Caller: Patient  Follow-up for Phone Call        ok to refill x1 Follow-up by: Loreen Freud DO,  May 21, 2009 8:46 AM    Prescriptions: AMBIEN 10 MG  TABS (ZOLPIDEM TARTRATE) 1/2 TAB 3 X EVERY WEEK  #30 x 0   Entered by:   Army Fossa CMA   Authorized by:   Loreen Freud DO   Signed by:   Army Fossa CMA on 05/21/2009   Method used:   Printed then faxed to ...       [facility]ail[facility]       [address]       [place], [place]  [address]       Ph: [phone]       Fax: [phone]   RxID:   [id number]

## 2010-03-25 NOTE — Letter (Signed)
Summary: Surgical Clearance/[facility]  Surgical Clearance/[facility]   Imported By: Lanelle Bal 02/05/2010 13:17:45  _____________________________________________________________________  External Attachment:    Type:   Image     Comment:   External Document

## 2010-03-25 NOTE — Progress Notes (Signed)
SummaryPrudy Feeler RF  Phone Note Refill Request Message from:  Patient on January 07, 2010 11:22 AM  Refills Requested: Medication #1:  XANAX 0.5 MG TABS TAKE ONE TABLET DAILY Last seen 01/30/09 last filled 07/21/09 and upcoming exam 01/27/10 with Dr.Lowne... PLease advise   Method Requested: Fax to Local Pharmacy Next Appointment Scheduled: 01/27/10 Initial call taken by: Almeta Monas CMA Duncan Dull),  January 07, 2010 11:22 AM  Follow-up for Phone Call        refill x1 Follow-up by: Loreen Freud DO,  January 07, 2010 11:59 AM    Prescriptions: Prudy Feeler 0.5 MG TABS (ALPRAZOLAM) TAKE ONE TABLET DAILY  #30 x 0   Entered by:   Almeta Monas CMA (AAMA)   Authorized by:   Loreen Freud DO   Signed by:   Almeta Monas CMA (AAMA) on 01/07/2010   Method used:   Printed then faxed to ...       [facility]       [address]       [place], [place]  [address]       Ph: [phone]       Fax: [phone]   RxID:   [id number]  VM advising pt that Rx has been faxed to the above pharmacy.... Almeta Monas CMA Duncan Dull)  January 07, 2010 12:03 PM

## 2010-03-25 NOTE — Progress Notes (Signed)
Summary: refill clarify  Phone Note Refill Request Message from:  Pharmacy  Refills Requested: Medication #1:  IMPRAMINE 75 MG 1 once daily Pls clarify strength med does not come in 75mg . Pt profile states that Pt has in the past taken HCL 3 tab daily.Available meds HCL 25mg  or Pamoate 75mg . Pls advise  .[facility] Deloach CMA  February 05, 2010 3:15 PM  [facility]: [phone] opt 2 [phone].........Marland KitchenFelecia Deloach CMA  February 05, 2010 3:09 PM    Follow-up for Phone Call        done Follow-up by: Loreen Freud DO,  February 05, 2010 4:27 PM  Additional Follow-up for Phone Call Additional follow up Details #1::        call from patient and she stated the meds cost too much at [facility] and would like the rx sent to [facility]..... Rx faxed. Additional Follow-up by: Almeta Monas CMA Duncan Dull),  February 05, 2010 4:57 PM    New/Updated Medications: IMIPRAMINE HCL 25 MG TABS (IMIPRAMINE HCL) 3 by mouth at bedtime Prescriptions: IMIPRAMINE HCL 25 MG TABS (IMIPRAMINE HCL) 3 by mouth at bedtime  #270 x 1   Entered by:   Almeta Monas CMA (AAMA)   Authorized by:   Loreen Freud DO   Signed by:   Almeta Monas CMA (AAMA) on 02/05/2010   Method used:   Faxed to ...       [facility] (mail-order)             , [place]         Ph: [phone]       Fax: [phone]   RxID:   [id number] IMIPRAMINE HCL 25 MG TABS (IMIPRAMINE HCL) 3 by mouth at bedtime  #270 x 1   Entered and Authorized by:   Loreen Freud DO   Signed by:   Loreen Freud DO on 02/05/2010   Method used:   Electronically to        [facility] (retail)       [address]       [place], [place]  [address]       Ph: [phone]       Fax: [phone]   RxID:   [id number]

## 2010-03-25 NOTE — Assessment & Plan Note (Signed)
Summary: Prolia injection/drb   Vital Signs:  Patient profile:   77 year old female Weight:      114 pounds Temp:     98.1 degrees F oral Pulse rate:   78 / minute Pulse rhythm:   regular BP sitting:   122 / 80  (left arm) Cuff size:   regular  Vitals Entered By: Army Fossa CMA (March 11, 2009 10:34 AM) CC: prolia injection    History of Present Illness: pt here for prolia injection --  no problems  Allergies: 1)  ! Codeine 2)  ! Demerol 3)  ! Vicodin 4)  ! Clindamycin 5)  ! Cephalexin 6)  ! Pcn  Physical Exam  General:  Well-developed,well-nourished,in no acute distress; alert,appropriate and cooperative throughout examination Psych:  Oriented X3 and normally interactive.     Impression & Recommendations:  Problem # 1:  OSTEOPOROSIS (ICD-733.00)  The following medications were removed from the medication list:    Forteo 600 Mcg/2.64ml Soln (Teriparatide (recombinant)) .Marland Kitchen... 20 micrograms [place] once daily Her updated medication list for this problem includes:    Vitamin D3 1000 Unit Caps (Cholecalciferol) .Marland KitchenMarland KitchenMarland KitchenMarland Kitchen 3 a day  Orders: Admin of Therapeutic Inj  intramuscular or subcutaneous ([address]) Prolia 60mg  (U0454)  Bone Density: 0steoporosis (01/12/2009) Vit D:58 (01/26/2009), 58 (02/07/2008)  Complete Medication List: 1)  Xanax 0.5 Mg Tabs (Alprazolam) .... Take one tablet daily 2)  Impramine 75 Mg  .Marland Kitchen.. 1 once daily 3)  Atenolol 25 Mg Tabs (Atenolol) .Marland Kitchen.. 1 by mouth once daily 4)  Ambien 10 Mg Tabs (Zolpidem tartrate) .... 1/2 tab 3 x every week 5)  Fish Oil 1200 Mg Caps (Omega-3 fatty acids) .Marland Kitchen.. 1 once daily 6)  Calcium  .Marland KitchenMarland Kitchen. 1800 mg dailky 7)  Adult Aspirin Low Strength 81 Mg Tbdp (Aspirin) .Marland Kitchen.. 1 by mouth once daily 8)  Zostavax 09811 Unt/0.13ml Solr (Zoster vaccine live) .Marland Kitchen.. 1 ml im x1 9)  Claritin 10 Mg Tabs (Loratadine) .Marland Kitchen.. 1 by mouth once daily 10)  Vitamin D3 1000 Unit Caps (Cholecalciferol) .... 3 a day 11)  Bd Insulin Syringe Ultrafine 29g X  1/2" 0.3 Ml Misc (Insulin syringe-needle u-100) .... As directed 12)  Celebrex 200 Mg Caps (Celecoxib) .Marland Kitchen.. 1 by mouth once daily 13)  Prilosec Otc 20 Mg Tbec (Omeprazole magnesium) .Marland Kitchen.. 1 by mouth once daily 14)  Livalo 2 Mg Tabs (Pitavastatin calcium) .Marland Kitchen.. 1 by mouth daily.  Patient Instructions: 1)  rto 6 months   Medication Administration  Injection # 1:    Medication: Prolia 60mg     Diagnosis: OSTEOPOROSIS (ICD-733.00)    Route: SQ    Site: R deltoid    Exp Date: 02/2010    Lot #: [id number]    Mfr: [email or web address]    Patient tolerated injection without complications    Given by: Army Fossa CMA (March 11, 2009 10:51 AM)  Orders Added: 1)  Admin of Therapeutic Inj  intramuscular or subcutaneous [96372] 2)  Prolia 60mg  [J3590] 3)  Est. Patient Level II [56213]

## 2010-03-25 NOTE — Miscellaneous (Signed)
Summary: [facility]   Imported By: Lanelle Bal 03/05/2010 09:09:55  _____________________________________________________________________  External Attachment:    Type:   Image     Comment:   External Document

## 2010-03-25 NOTE — Progress Notes (Signed)
Summary: Xanax Refill  Phone Note Refill Request Message from:  Patient on March 05, 2010 8:49 AM  Refills Requested: Medication #1:  XANAX 0.5 MG TABS TAKE ONE TABLET DAILY   Last Refilled: 01/29/2010 wants to get a 6 month supply of her meds, last seen 01/29/10 and filled 01/29/10---Pharmacy not given...... please advise   Method Requested: Fax to Local Pharmacy Initial call taken by: Almeta Monas CMA Duncan Dull),  March 05, 2010 8:48 AM  Follow-up for Phone Call        ok to refill x1,  5 refills  Follow-up by: Loreen Freud DO,  March 05, 2010 9:29 AM    Prescriptions: Prudy Feeler 0.5 MG TABS (ALPRAZOLAM) TAKE ONE TABLET DAILY  #30 x 5   Entered by:   Almeta Monas CMA (AAMA)   Authorized by:   Loreen Freud DO   Signed by:   Almeta Monas CMA (AAMA) on 03/05/2010   Method used:   Printed then faxed to ...       [facility]       [address]       [place], [place]  [address]       Ph: [phone]       Fax: [phone]   RxID:   [id number]

## 2010-03-25 NOTE — Assessment & Plan Note (Signed)
Summary: flu shot/kn  Nurse Visit   Allergies: 1)  ! Codeine 2)  ! Demerol 3)  ! Vicodin 4)  ! Clindamycin 5)  ! Cephalexin 6)  ! Pcn  Orders Added: 1)  Flu Vaccine 42yrs + MEDICARE PATIENTS [Q2039] 2)  Administration Flu vaccine - MCR [G0008] Flu Vaccine Consent Questions     Do you have a history of severe allergic reactions to this vaccine? no    Any prior history of allergic reactions to egg and/or gelatin? no    Do you have a sensitivity to the preservative Thimersol? no    Do you have a past history of Guillan-Barre Syndrome? no    Do you currently have an acute febrile illness? no    Have you ever had a severe reaction to latex? no    Vaccine information given and explained to patient? yes    Are you currently pregnant? no    Lot Number:[id number]   Exp Date:08/21/2010   Manufacturer: [employer]    Site Given  Left Deltoid IM.medflu

## 2010-03-25 NOTE — Assessment & Plan Note (Signed)
Summary: PROLIA SHOT//JR;  LAB=NMR, HEP  272.4////SPH  Nurse Visit   Allergies: 1)  ! Codeine 2)  ! Demerol 3)  ! Vicodin 4)  ! Clindamycin 5)  ! Cephalexin 6)  ! Pcn  Medication Administration  Injection # 1:    Medication: Prolia 60mg     Diagnosis: OSTEOPOROSIS (ICD-733.00)    Route: SQ    Site: L deltoid    Exp Date: 01/22/2011    Lot #: [id number]    Mfr: amgen    Patient tolerated injection without complications    Given by: Jeremy Johann CMA (September 07, 2009 11:20 AM)  Orders Added: 1)  Venipuncture [56213] 2)  TLB-Hepatic/Liver Function Pnl [80076-HEPATIC] 3)  T-NMR, Lipoprofile [83704-82965] 4)  Specimen Handling [99000] 5)  Admin of Therapeutic Inj  intramuscular or subcutaneous [96372] 6)  Prolia 60mg  [J3590]

## 2010-03-25 NOTE — Medication Information (Signed)
Summary: [facility]  [facility]   Imported By: Lanelle Bal 06/17/2009 12:17:48  _____________________________________________________________________  External Attachment:    Type:   Image     Comment:   External Document

## 2010-03-25 NOTE — Progress Notes (Signed)
Summary: refill  Phone Note Refill Request Message from:  Fax from Pharmacy on October 14, 2009 2:02 PM  Refills Requested: Medication #1:  AMBIEN 10 MG  TABS 1/2 TAB 3 X EVERY WEEK [facility]- fax [phone]  Initial call taken by: Okey Regal [place],  October 14, 2009 2:03 PM  Follow-up for Phone Call        LAST OV 03-11-09, LAST FILLED 05-21-09 #30.......Marland KitchenFelecia Deloach CMA  October 14, 2009 2:11 PM   Additional Follow-up for Phone Call Additional follow up Details #1::        refill x1 Additional Follow-up by: Loreen Freud DO,  October 14, 2009 3:24 PM    Prescriptions: AMBIEN 10 MG  TABS (ZOLPIDEM TARTRATE) 1/2 TAB 3 X EVERY WEEK  #30 x 0   Entered by:   Jeremy Johann CMA   Authorized by:   Loreen Freud DO   Signed by:   Jeremy Johann CMA on 10/14/2009   Method used:   Printed then faxed to ...       [facility]       [address]       [place], [place]  [address]       Ph: [phone]       Fax: [phone]   RxID:   [id number]

## 2010-03-25 NOTE — Assessment & Plan Note (Signed)
Summary: FLU SHOT//CYD  Nurse Visit    Prior Medications: XANAX 0.5 MG TABS (ALPRAZOLAM) TAKE ONE TABLET DAILY VERAMYST 27.5 MCG/SPRAY  SUSP (FLUTICASONE FUROATE)  LIPITOR 40 MG  TABS (ATORVASTATIN CALCIUM) Take one tablet daily IMPRAMINE 75 MG () 1 once daily ACTONEL 35 MG  TABS (RISEDRONATE SODIUM) ONCE WEEKLY ATENOLOL 25 MG  TABS (ATENOLOL) 1 by mouth once daily AMBIEN 10 MG  TABS (ZOLPIDEM TARTRATE) 1/2 TAB 3 X EVERY WEEK FISH OIL 1200 MG  CAPS (OMEGA-3 FATTY ACIDS) 1 once daily CALCIUM () 1800 MG DAILKY ADULT ASPIRIN LOW STRENGTH 81 MG  TBDP (ASPIRIN) 1 by mouth once daily PRILOSEC OTC 20 MG  TBEC (OMEPRAZOLE MAGNESIUM) 1 once daily DARVOCET-N 100 100-650 MG  TABS (PROPOXYPHENE N-APAP) 1 by mouth every 6 hours prn Current Allergies: ! CODEINE ! DEMEROL ! VICODIN    Orders Added: 1)  Admin 1st Vaccine [90471] 2)  Flu Vaccine 47yrs + [82956]   Flu Vaccine Consent Questions     Do you have a history of severe allergic reactions to this vaccine? no    Any prior history of allergic reactions to egg and/or gelatin? no    Do you have a sensitivity to the preservative Thimersol? no    Do you have a past history of Guillan-Barre Syndrome? no    Do you currently have an acute febrile illness? no    Have you ever had a severe reaction to latex? no    Vaccine information given and explained to patient? yes    Are you currently pregnant? no    Lot Number:[id number]   Exp Date:08/20/2008   Site Given  Left Deltoid IM].opcflu

## 2010-03-31 NOTE — Assessment & Plan Note (Signed)
Summary: Prolia Inj  Nurse Visit   Allergies: 1)  ! Codeine 2)  ! Demerol 3)  ! Vicodin 4)  ! Clindamycin 5)  ! Cephalexin 6)  ! Pcn  Medication Administration  Injection # 1:    Medication: Prolia 60mg     Diagnosis: OSTEOPOROSIS (ICD-733.00)    Route: SQ    Site: R deltoid    Exp Date: 02/22/2011    Lot #: [id number]    Mfr: amgen    Patient tolerated injection without complications    Given by: Almeta Monas CMA Duncan Dull) (March 24, 2010 10:55 AM)  Orders Added: 1)  Admin of Therapeutic Inj  intramuscular or subcutaneous [96372] 2)  Prolia 60mg  [J3590]

## 2010-04-17 ENCOUNTER — Encounter: Payer: Self-pay | Admitting: Family Medicine

## 2010-07-09 NOTE — H&P (Signed)
NAME:  Beth Collier, TOUTANT                          ACCOUNT NO.:  [id number]   MEDICAL RECORD NO.:  [id number]                   PATIENT TYPE:  INP   LOCATION:  NA                                   FACILITY:  [facility]   PHYSICIAN:  Marlowe Kays, M.D.               DATE OF BIRTH:  1930/09/02   DATE OF ADMISSION:  DATE OF DISCHARGE:                                HISTORY & PHYSICAL   Scheduled for admission [facility], __________ 30, 2004.   CHIEF COMPLAINT:  Pain in my back and legs.   HISTORY OF PRESENT ILLNESS:  This is a 77 year old white female seen by Dr.  Simonne Come, through the courtesy of referral by Dr. Leodis Sias.  She was  first seen in the early part of this year.  Over the year, she has had  marked increase in pain and discomfort into the lower extremities.  She is a  very active lady despite her age, and now she is having extreme difficulty  getting about.  Conservative measures including nonsteroidal anti-  inflammatories have been tried, but now she has continuing discomfort.  Significant findings on MRI at the L4-L5 level show an ossified disk  complex, spondylolisthesis and hypertrophy of the dorsal ligaments combined  to result in a canal stenosis, also bilateral vertebrae ridging was  demonstrated with bilateral foraminal stenosis.  The patient has had a  negative straight leg raising and figure-of-four test bilaterally.  Motor  and strength and sensation as well as circulation lower extremities is equal  and normal.  After much discussion and consideration of the complexities of  surgery as well as the side effects, it was decided to go ahead with a  central decompressive lumbar laminectomy at the L4-L5.   PAST MEDICAL HISTORY:  1. This lady has been in relatively good health throughout her lifetime.  2. She has some mild anxiety/depression.   SURGERIES:  Include:  1. Appendectomy.  2. Removal of ganglion cyst to the left wrist.   She takes Evista and  imipramine.  She uses Fosamax for osteoporosis, Xanax  0.25 for anxiety and Darvocet-N 100 for discomfort.  Atenolol for  tachycardia.   She is allergic to:  1. CODEINE.  2. CELEBREX.  3. VIOXX.   Dr. Leodis Sias of [facility] on [address] is her family  physician.   SOCIAL HISTORY:  The patient is widowed, neither smokes nor drinks.   FAMILY HISTORY:  Noncontributory.   REVIEW OF SYSTEMS:  CNS:  No seizure disorder, problems with numbness,  double vision.  RESPIRATORY:  No productive cough, no hemoptysis, no  shortness of breath.  CARDIOVASCULAR:  No chest pain, no angina, no  orthopnea.  GASTROINTESTINAL:  No nausea, vomiting, or general malaise.  GENITOURINARY:  No discharge or hematuria.  MUSCULOSKELETAL:  Primarily in  present illness.   PHYSICAL EXAMINATION:  GENERAL:  Alert, cooperative and friendly, somewhat  apprehensive 77 year old white female.  VITAL SIGNS:  Blood pressure 122/78, pulse 88, respirations 12.  HEENT:  She wears glasses.  PERRL.  EOM intact.  Oropharynx is clear.  CHEST:  Clear to auscultation.  No rhonchi, no rales.  HEART:  Regular, rate and rhythm.  No murmurs are heard.  ABDOMEN:  Soft, nontender.  Liver, spleen not felt.  GENITALIA:  Not done.  Not pertinent to present illness.  RECTAL:  Not done.  Not pertinent to present illness.  PELVIC:  Not done.  Not pertinent to present illness.  BREASTS:  Not done.  Not pertinent to present illness.  EXTREMITIES:  As in present illness above.   ADMITTING DIAGNOSES:  1. Spinal stenosis L4-L5.  2. History of tachycardia.  3. Anxiety/depression.   PLAN:  The patient will undergo central decompressive lumbar laminectomy at  L4-L5.     Dooley L. Cherlynn June.                 Marlowe Kays, M.D.    DLU/MEDQ  D:  02/11/2003  T:  02/11/2003  Job:  [id number]   cc:   Thelma Barge P. Modesto Charon, M.D.  [address]  [place]  [place] [address]  Fax: [phone]

## 2010-07-09 NOTE — Assessment & Plan Note (Signed)
[facility]                               LIPID CLINIC NOTE   NAME:Beth Collier, Beth Collier                       MRN:          [id number]  DATE:11/14/2005                            DOB:          [birth year]    REFERRING PHYSICIAN:  Loreen Freud, M.D.   HISTORY:  The patient is seen at the Lipid Clinic at the request of Dr.  Loreen Freud for evaluation and medication titration associated with her  hypercholesterolemia.  The patient states that she has had problems for  some time in taking her cholesterol medicines.  After going through  these medications, she states that she has GI issues with each of them.  She has had muscle cramping in the sides bilaterally at first, but then  progressed.  The patient has had periodic episodes of lightheadedness  where her head felt funny.  She has a history of neck pain, as well as  anxiety and depression.   DIET:  The patient follows a relatively low-fat, low-cholesterol diet.  The patient has had diet changes, associated with a low-fat, low-  cholesterol diet.   Most recent life of science profile reveals very high lipid particle  count, with LDL particle of 2553, small LDL particle was high at 1256.  The calculated LDL 175, HDL good at 60, triglycerides 187, total  cholesterol 272.  The LDL particle size is initially large pattern A.   The patient states that with all statins, Welchol and Zetia, she  experienced significant side effect.   PHYSICAL EXAMINATION:  VITAL SIGNS:  Blood pressure today 124/92, heart  rate 68, respirations 16.  Weight 108 pounds and 4 ounces.   MEDICATIONS:  List has been appended to the chart.   LABORATORY DATA:  Are as shown.   ASSESSMENT:  The patient has not completely understood the reasoning  behind her need for lipid-lowering therapy.  Greater than 35 minutes  spent with the patient discussing the need for lipid-lowering therapy,  based on the significant elevation in her LDL  cholesterol.   PLAN:  The patient agrees that she will begin taking this therapy, and  will be consistent with it.  She will begin taking Crestor 2.5 mg daily.  She will call for questions or problems in the meantime.  She will  follow up in six weeks with labs and in seven weeks in the clinic.   Upon followup, the patient states that she will be unable to return to  the Lipid Clinic, as she has to drive across town and it makes her  extremely anxious.  I have discussed this with Dr. Loreen Freud, who  will follow up with her labs and further titrations.  It is expected the  patient will be able to tolerate this therapy and will do well for Dr.  Laury Axon, now that she understands the  rationale behind this therapy.  She thanked me for seeing her and will  call with questions or problems in the meantime.      Shelby Dubin, PharmD, BCPS, CPP  Electronically Signed  Rollene Rotunda, MD, [facility]  Electronically Signed   MP/MedQ  DD: 12/26/2005  DT: 12/27/2005  Job #: [id number]

## 2010-07-09 NOTE — Op Note (Signed)
NAME:  Beth Collier, Beth Collier                ACCOUNT NO.:  [id number]   MEDICAL RECORD NO.:  [id number]          PATIENT TYPE:  AMB   LOCATION:  ENDO                         FACILITY:  [facility]   PHYSICIAN:  Graylin Shiver, M.D.   DATE OF BIRTH:  May 23, 1930   DATE OF PROCEDURE:  08/30/2004  DATE OF DISCHARGE:                                 OPERATIVE REPORT   PROCEDURE:  Colonoscopy.   INDICATIONS FOR PROCEDURE:  A 77 year old female with family history of  colon polyps in two sisters. She had a colonoscopy 12 years ago that was  negative. She is interested in having a follow-up examination.   Informed consent obtained after explanation of the risks of bleeding,  infection, and perforation.   PREMEDICATION:  Fentanyl 70 mcg IV, Versed 7 mg IV.   DESCRIPTION OF PROCEDURE:  With the patient in the left lateral decubitus  position, a rectal exam was performed. No masses were felt. The Olympus  colonoscope was inserted into the rectum and advanced around colon to the  cecum. Cecal landmarks were identified. The cecum and ascending colon were  normal. The transverse colon normal, the descending colon normal. The  sigmoid showed a few diverticula. The rectum was normal. She tolerated the  procedure well without complications.   IMPRESSION:  Few diverticula in the sigmoid colon otherwise normal  colonoscopy to the cecum.   The patient has had two negative colonoscopies, one 12 years ago and one now  for a colon polyp. Even though she has a family history of colon polyps in  two sisters, I am not sure if it would be necessary to repeat another  colonoscopy on this patient.  I would base further colon examinations on  symptoms given her age of 77 years old.       SFG/MEDQ  D:  08/30/2004  T:  08/30/2004  Job:  [id number]   cc:   Maryla Morrow. Modesto Charon, M.D.  [address]  [place]  [place] [address]  Fax: [phone]

## 2010-07-09 NOTE — Op Note (Signed)
NAME:  Beth Beth, Beth Beth                          ACCOUNT NO.:  [id number]   MEDICAL RECORD NO.:  [id number]                   PATIENT TYPE:  INP   LOCATION:  0001                                 [facility]ACILITY[facility]  [facility]   PHYSICIAN:  Marlowe Kays, M.D.               DATE OF BIRTH:  [birth year]   DATE OF PROCEDURE:  02/20/2003  DATE OF DISCHARGE:                                 OPERATIVE REPORT   PREOPERATIVE DIAGNOSIS:  Degenerative spondylolisthesis L4 on L5 with severe  spinal stenosis and subtotal myelographic block; possible small disk  herniation L4-5.   POSTOPERATIVE DIAGNOSIS:  Degenerative spondylolisthesis L4 on L5 with  severe spinal stenosis and subtotal myelographic block; possible small disk  herniation L4-5.   OPERATION:  Central decompressive laminectomy L3 to L5 with minimal  microdiskectomy L4-5.   SURGEON:  Marlowe Kays, M.D.   ASSISTANT:  Ronnell Guadalajara, M.D.   ANESTHESIA:  General.   INDICATIONS FOR PROCEDURE:  Severe back and bilateral leg pain, recently  more pain in the back than the legs. Plain x-rays have demonstrated grade 1  degenerative spondylolisthesis of L4 and L5 and myelogram and CT scan  demonstrated almost a complete myelographic block at L4-5 with what was felt  to be a possible small  disk herniation versus major disk bulge at L4-5. \   It was thoroughly explained to her and her family  before surgery that she  had significant arthritic changes throughout her lumbar spine, and that  today's surgery was only designed to take care of the spinal stenosis  problem.   DESCRIPTION OF PROCEDURE:  Prophylactic antibiotics. Prone position  on the  Wilson frame. The back was prepped with Duraprep, draped as a sterile field.  With 3 spinal needles and lateral x-ray we tentatively localized the spinous  process of L4 which was our main focus for removal.  We then continued draping the back in a sterile field. Beth Beth was employed.  Vertical midline   incision.   I isolated what we thought was the spinous process and neural arch of L4 and  tied this with a Kocher clamp and a 2nd lateral x-ray was taken confirming  that this was the neural arch of L4. This also seemed to be confirmed  anatomically in that she had a good bit of motion inconsistent with  instability on plain x-ray.   I then dissected off the soft tissue from the inferior  portion of L3 down  to the superior portion of L5 and placed the self-retaining McCullough  retractors. With a double  action rongeur I removed the spinous process of  L4 as well as a good bit of the neural arch. The stenosis was extremely  tight and we had to move very carefully, and early on brought in the  microscope.   Using a combination of 2 and 3-mm Kerrison rongeurs, we began removing the  remainder of the  neural arch of L4, also the superior portion of the lamina  of L5, working up to L3 and perhaps even removing a bit of that bone as  well. The basic pathology seemed to be a very hypertrophic ligament of  Flavum associated with synovial cyst formation at L4-5.   Again working with great care, we gradually  removed all this flavum and the  synovial cyst and decompressed her up beneath L3 and also down beneath the  remaining neural arch of L5 as well as decompressing the nerve roots on  either  side. We then looked at the L4-5 disk  on the left, protecting the  L5 nerve root medially.   The disk seemed to be a little soft, so I did open with a minimal cruciate  incision and with Epstein curets first tried to soften up the disk  a little  bit and then used a micropituitary and removed only a small amount of disk  material. Basically the disk did not appear to be a real pathologic fracture  in her disease pattern.   To try and minimize instability, we did not pursue the diskectomy any  further. We placed bone wax around the bony perimeter. We irrigated the  wound well with sterile saline and  placed Gelfoam soaked thrombin over the  dura leaving a small  aperture for any egress of blood.   I then removed the self-retaining retractors. There was minimal bleeding. A  1/4 inch Penrose drain was placed in the left posterior flank and the wound  was carefully closed over the Penrose drain with interrupted #1 Vicryl in  the paralumbar muscle and fascia,  2-0 Vicryl in the subcutaneous tissue and staples in the skin. Adaptic  dressings were applied.   The patient tolerated the procedure well. She was taken to the recovery room  in satisfactory condition with minimal complications. Estimated blood loss  was perhaps 200 mL with no blood replacement.                                               Marlowe Kays, M.D.    JA/MEDQ  D:  02/20/2003  T:  02/20/2003  Job:  [id number]

## 2010-07-09 NOTE — Discharge Summary (Signed)
NAME:  Beth Collier, Beth Collier                          ACCOUNT NO.:  [id number]   MEDICAL RECORD NO.:  [id number]                   PATIENT TYPE:  INP   LOCATION:  0467                                 FACILITY:  [facility]   PHYSICIAN:  Marlowe Kays, M.D.               DATE OF BIRTH:  [birth year]   DATE OF ADMISSION:  02/20/2003  DATE OF DISCHARGE:  02/22/2003                                 DISCHARGE SUMMARY   ADMITTING DIAGNOSES:  1. Spinal stenosis L4-L5.  2. History of tachycardia.  3. Anxiety/depression.   DISCHARGE DIAGNOSES:  1. Spinal stenosis L4-L5.  2. History of tachycardia.  3. Anxiety/depression.   OPERATION:  On February 20, 2003 the patient underwent central decompressive  lumbar laminectomy L3-L5 with minimal microdiskectomy L4-L5.  Dr. Ronnell Guadalajara assisted.   BRIEF HISTORY:  This 77 year old female with severe back and bilateral leg  pain has been seen by [place] for progressive problems concerning primarily pain  into her back.  X-rays showed a degenerative spondylolisthesis L4-L5 and  myelogram CT scan demonstrated almost a complete myelographic block at L4-5.  This was felt to be a possible small disk herniation versus major disk  bulge.  After thorough explanation of surgery as well as complications,  decided to go ahead and admit the patient for the above procedure.   HOSPITAL COURSE:  The patient tolerated the surgical procedure quite well.  She was really quite relieved at the reduction in her leg discomfort.  The  patient was able to get out of bed with some help, go to the bathroom.  The  minimal leg pain that she did have postoperatively was felt to be transient.  The drain was removed on February 21, 2003 from the back.  Dry dressing was  applied.  On the day of discharge her pain was controlled with p.o.  medications.  She was quite ready to go home.  Neurovascular was intact to  the lower extremities.  It was felt she could be maintained in her home  environment.  Her vital signs were stable and she voided with no problems  and arrangements were made for discharge.   LABORATORIES:  Laboratory values in the hospital showed CBC with  differential completely within normal limits.  Blood chemistries also were  normal.  She had some minimal hyponatremia at 134.  Urinalysis was negative  for urinary tract infection.  Electrocardiogram showed sinus tachycardia  with nonspecific T-wave abnormality.  Chest x-ray showed no active  disease.   CONDITION ON DISCHARGE:  Improved, stable.   PLAN:  The patient will be discharged to her home.  She is given Demerol for  discomfort and Robaxin as a muscle relaxant.  She is to change her bandage  once a day.  Return to see Dr. Simonne Come about two weeks after the date of  surgery.  She is to call if she has any problems and follow  up medically  with Dr. Leodis Sias.     Dooley L. Cherlynn June.                 Marlowe Kays, M.D.    DLU/MEDQ  D:  03/04/2003  T:  03/04/2003  Job:  [id number]   cc:   Thelma Barge P. Modesto Charon, M.D.  [address]  [place]  [place] [address]  Fax: [phone]

## 2010-07-22 ENCOUNTER — Telehealth: Payer: Self-pay

## 2010-07-22 MED ORDER — ZOLPIDEM TARTRATE 10 MG PO TABS: ORAL_TABLET | ORAL | Status: DC

## 2010-07-22 NOTE — Telephone Encounter (Signed)
Ok for #30, no refills.  Should not use more than 3-4x/week

## 2010-07-22 NOTE — Telephone Encounter (Signed)
Spoke with patient and advised her of Dr.Tabori's recommendations and she voiced understanding  Rx sent to the pharmacy     KP

## 2010-07-22 NOTE — Telephone Encounter (Signed)
Call from patient and she stated she had a death in the family and some other things going on and she has not been able to sleep. She wanted to get a prescription for Ambien sent to [facility] on [address]. She was last seen 01/29/10 and filled 09/24/09. Please advise     KP

## 2010-08-23 ENCOUNTER — Other Ambulatory Visit: Payer: Self-pay | Admitting: Family Medicine

## 2010-08-23 ENCOUNTER — Other Ambulatory Visit: Payer: Self-pay | Admitting: *Deleted

## 2010-08-23 DIAGNOSIS — F419 Anxiety disorder, unspecified: Secondary | ICD-10-CM

## 2010-08-23 NOTE — Telephone Encounter (Signed)
Rx faxed

## 2010-08-23 NOTE — Telephone Encounter (Signed)
Faxed.   KP 

## 2010-08-23 NOTE — Telephone Encounter (Signed)
Refill x1 

## 2010-08-23 NOTE — Telephone Encounter (Signed)
Last filled 03-05-10 #30 5, last OV 01-29-10.

## 2010-08-23 NOTE — Telephone Encounter (Signed)
Last seen 01/29/10 and filled 03/05/10 # 30 with 5 refills. Please advise    KP

## 2010-09-22 ENCOUNTER — Ambulatory Visit: Payer: BC Managed Care – PPO | Admitting: Family Medicine

## 2010-09-27 ENCOUNTER — Ambulatory Visit: Payer: BC Managed Care – PPO | Admitting: Family Medicine

## 2010-09-28 ENCOUNTER — Ambulatory Visit (INDEPENDENT_AMBULATORY_CARE_PROVIDER_SITE_OTHER): Payer: BC Managed Care – PPO | Admitting: *Deleted

## 2010-09-28 ENCOUNTER — Ambulatory Visit: Payer: BC Managed Care – PPO | Admitting: Family Medicine

## 2010-09-28 DIAGNOSIS — M810 Age-related osteoporosis without current pathological fracture: Secondary | ICD-10-CM

## 2010-09-29 MED ORDER — DENOSUMAB 60 MG/ML ~~LOC~~ SOLN
60.0000 mg | Freq: Once | SUBCUTANEOUS | Status: AC
  Administered 2010-09-28: 60 mg via SUBCUTANEOUS

## 2010-10-20 ENCOUNTER — Other Ambulatory Visit: Payer: Self-pay | Admitting: Family Medicine

## 2010-10-20 NOTE — Telephone Encounter (Signed)
Last OV12-9-11, last filled 07-22-10 #30

## 2010-10-20 NOTE — Telephone Encounter (Signed)
Rx faxed

## 2010-11-23 ENCOUNTER — Other Ambulatory Visit: Payer: Self-pay | Admitting: Family Medicine

## 2010-11-23 NOTE — Telephone Encounter (Signed)
Faxed.   KP 

## 2010-11-23 NOTE — Telephone Encounter (Signed)
Last seen 01/29/10 and filled 08/23/10 # 30 with 2 refills. Pleas advise    KP

## 2010-12-02 ENCOUNTER — Ambulatory Visit (INDEPENDENT_AMBULATORY_CARE_PROVIDER_SITE_OTHER): Payer: Medicare Other

## 2010-12-02 DIAGNOSIS — Z23 Encounter for immunization: Secondary | ICD-10-CM

## 2010-12-07 ENCOUNTER — Other Ambulatory Visit: Payer: Self-pay | Admitting: Family Medicine

## 2010-12-07 NOTE — Telephone Encounter (Signed)
Pt has pending OV 02-02-11

## 2010-12-11 ENCOUNTER — Other Ambulatory Visit: Payer: Self-pay | Admitting: Family Medicine

## 2011-01-19 ENCOUNTER — Other Ambulatory Visit: Payer: Self-pay | Admitting: Family Medicine

## 2011-01-19 NOTE — Telephone Encounter (Addendum)
Last seen 01/29/10 and filled 12/22/10. Please advise    KP--- Dr.Lowne Patient

## 2011-01-20 ENCOUNTER — Other Ambulatory Visit: Payer: Self-pay

## 2011-01-20 MED ORDER — ALPRAZOLAM 0.5 MG PO TABS: 0.5000 mg | ORAL_TABLET | Freq: Every day | ORAL | Status: DC | PRN

## 2011-01-20 NOTE — Telephone Encounter (Signed)
Dr.Tabori approved it already.      KP

## 2011-01-20 NOTE — Telephone Encounter (Signed)
Refill requested last seen 01/29/10 and filled 12/11/10-- upcoming apt 01/31/11 please advise ----Dr.Lowne patient   KP

## 2011-01-20 NOTE — Telephone Encounter (Signed)
What is medication

## 2011-01-28 ENCOUNTER — Encounter: Payer: Self-pay | Admitting: Family Medicine

## 2011-01-31 ENCOUNTER — Encounter: Payer: Self-pay | Admitting: Family Medicine

## 2011-01-31 ENCOUNTER — Ambulatory Visit (INDEPENDENT_AMBULATORY_CARE_PROVIDER_SITE_OTHER): Payer: Medicare Other | Admitting: Family Medicine

## 2011-01-31 VITALS — BP 116/70 | HR 94 | Temp 97.6°F | Ht 58.75 in | Wt 112.8 lb

## 2011-01-31 DIAGNOSIS — F329 Major depressive disorder, single episode, unspecified: Secondary | ICD-10-CM

## 2011-01-31 DIAGNOSIS — I10 Essential (primary) hypertension: Secondary | ICD-10-CM

## 2011-01-31 DIAGNOSIS — G4700 Insomnia, unspecified: Secondary | ICD-10-CM

## 2011-01-31 DIAGNOSIS — Z0000 Encounter for general adult medical examination without abnormal findings: Secondary | ICD-10-CM

## 2011-01-31 DIAGNOSIS — E785 Hyperlipidemia, unspecified: Secondary | ICD-10-CM

## 2011-01-31 DIAGNOSIS — M810 Age-related osteoporosis without current pathological fracture: Secondary | ICD-10-CM

## 2011-01-31 LAB — HEPATIC FUNCTION PANEL
AST: 29 U/L (ref 0–37)
Albumin: 4.3 g/dL (ref 3.5–5.2)
Alkaline Phosphatase: 55 U/L (ref 39–117)
Total Bilirubin: 0.6 mg/dL (ref 0.3–1.2)

## 2011-01-31 LAB — CBC WITH DIFFERENTIAL/PLATELET
Basophils Absolute: 0 10*3/uL (ref 0.0–0.1)
Eosinophils Absolute: 0.3 10*3/uL (ref 0.0–0.7)
Hemoglobin: 13.2 g/dL (ref 12.0–15.0)
Lymphocytes Relative: 31.5 % (ref 12.0–46.0)
Lymphs Abs: 1.8 10*3/uL (ref 0.7–4.0)
MCHC: 34 g/dL (ref 30.0–36.0)
Monocytes Relative: 9.9 % (ref 3.0–12.0)
Neutro Abs: 3.1 10*3/uL (ref 1.4–7.7)
Platelets: 287 10*3/uL (ref 150.0–400.0)
RDW: 13.2 % (ref 11.5–14.6)

## 2011-01-31 LAB — LIPID PANEL
Cholesterol: 187 mg/dL (ref 0–200)
HDL: 55.2 mg/dL (ref >39.00)
LDL Cholesterol: 106 mg/dL — ABNORMAL HIGH (ref 0–99)
VLDL: 25.8 mg/dL (ref 0.0–40.0)

## 2011-01-31 LAB — BASIC METABOLIC PANEL
BUN: 12 mg/dL (ref 6–23)
CO2: 28 mEq/L (ref 19–32)
Calcium: 9.1 mg/dL (ref 8.4–10.5)
GFR: 87.26 mL/min (ref >60.00)
Glucose, Bld: 99 mg/dL (ref 70–99)

## 2011-01-31 MED ORDER — IMIPRAMINE HCL 25 MG PO TABS: ORAL_TABLET | ORAL | Status: DC

## 2011-01-31 MED ORDER — ZOLPIDEM TARTRATE 10 MG PO TABS: ORAL_TABLET | ORAL | Status: DC

## 2011-01-31 MED ORDER — ATORVASTATIN CALCIUM 20 MG PO TABS: 20.0000 mg | ORAL_TABLET | Freq: Every day | ORAL | Status: DC

## 2011-01-31 MED ORDER — ALPRAZOLAM 0.5 MG PO TABS: 0.5000 mg | ORAL_TABLET | Freq: Every day | ORAL | Status: DC | PRN

## 2011-01-31 MED ORDER — ATENOLOL 25 MG PO TABS: ORAL_TABLET | ORAL | Status: DC

## 2011-01-31 NOTE — Progress Notes (Signed)
Subjective:    Beth Collier is a 77 y.o. female who presents for Medicare Annual/Subsequent preventive examination.  Preventive Screening-Counseling & Management  Tobacco History  Smoking status  . Never Smoker   Smokeless tobacco  . Not on file     Problems Prior to Visit 1.   Current Problems (verified) Patient Active Problem List  Diagnoses  . HYPERLIPIDEMIA  . ANXIETY  . DEPRESSION  . OTITIS MEDIA, SEROUS, LEFT  . LOSS, HEARING NOS  . HYPERTENSION  . URI  . LOC OSTEOARTHROS NOT SPEC PRIM/SEC UNSPEC SITE  . OSTEOPOROSIS  . MALAISE AND FATIGUE  . TACHYCARDIA, HX OF    Medications Prior to Visit Current Outpatient Prescriptions on File Prior to Visit  Medication Sig Dispense Refill  . aspirin 81 MG EC tablet Take 81 mg by mouth daily.        Marland Kitchen CALCIUM PO Take by mouth. 1800 mg daily       . Cholecalciferol (VITAMIN D3) 1000 UNITS CAPS Take by mouth. 3 a day       . loratadine (CLARITIN) 10 MG tablet Take 10 mg by mouth daily.        Marland Kitchen omeprazole (PRILOSEC OTC) 20 MG tablet Take 20 mg by mouth daily.        Marland Kitchen DISCONTD: atenolol (TENORMIN) 25 MG tablet TAKE 1 TABLET DAILY  90 tablet  0  . DISCONTD: atorvastatin (LIPITOR) 20 MG tablet Take 20 mg by mouth daily.        Marland Kitchen DISCONTD: imipramine (TOFRANIL) 25 MG tablet TAKE 3 TABLETS AT BEDTIME  270 tablet  0    Current Medications (verified) Current Outpatient Prescriptions  Medication Sig Dispense Refill  . ALPRAZolam (XANAX) 0.5 MG tablet Take 1 tablet (0.5 mg total) by mouth daily as needed for sleep.  30 tablet  2  . aspirin 81 MG EC tablet Take 81 mg by mouth daily.        Marland Kitchen atenolol (TENORMIN) 25 MG tablet 1 po qd  90 tablet  3  . atorvastatin (LIPITOR) 20 MG tablet Take 1 tablet (20 mg total) by mouth daily.  90 tablet  3  . CALCIUM PO Take by mouth. 1800 mg daily       . Cholecalciferol (VITAMIN D3) 1000 UNITS CAPS Take by mouth. 3 a day       . imipramine (TOFRANIL) 25 MG tablet 1 po qhs  270 tablet  3    . loratadine (CLARITIN) 10 MG tablet Take 10 mg by mouth daily.        Marland Kitchen omeprazole (PRILOSEC OTC) 20 MG tablet Take 20 mg by mouth daily.        Marland Kitchen zolpidem (AMBIEN) 10 MG tablet 1 po qhs prn  30 tablet  0  . DISCONTD: atenolol (TENORMIN) 25 MG tablet TAKE 1 TABLET DAILY  90 tablet  0  . DISCONTD: atorvastatin (LIPITOR) 20 MG tablet Take 20 mg by mouth daily.        Marland Kitchen DISCONTD: imipramine (TOFRANIL) 25 MG tablet TAKE 3 TABLETS AT BEDTIME  270 tablet  0     Allergies (verified) Cephalexin; Clindamycin; Codeine; Hydrocodone-acetaminophen; Meperidine hcl; and Penicillins   PAST HISTORY  Family History Family History  Problem Relation Age of Onset  . Breast cancer      <50  . Ovarian cancer    . Coronary artery disease      <60  . Depression    . Arthritis    .  Alcohol abuse    . Osteoporosis Mother   . Alzheimer's disease Mother   . Mental illness Mother     alzheimers  . Celiac disease Father   . Cancer Other     breast, ovarian    Social History History  Substance Use Topics  . Smoking status: Never Smoker   . Smokeless tobacco: Not on file  . Alcohol Use: No     Are there smokers in your home (other than you)? No  Risk Factors Current exercise habits: Exercise is limited by orthopedic condition(s): oa.  Dietary issues discussed: NA   Cardiac risk factors: advanced age (older than 11 for men, 24 for women), dyslipidemia, hypertension and sedentary lifestyle.  Depression Screen (Note: if answer to either of the following is "Yes", a more complete depression screening is indicated)   Over the past two weeks, have you felt down, depressed or hopeless? Yes  Over the past two weeks, have you felt little interest or pleasure in doing things? No  Have you lost interest or pleasure in daily life? Yes  Do you often feel hopeless? No  Do you cry easily over simple problems? No  Activities of Daily Living In your present state of health, do you have any difficulty  performing the following activities?:  Driving? No Managing money?  No Feeding yourself? No Getting from bed to chair? No Climbing a flight of stairs? No Preparing food and eating?: No Bathing or showering? No Getting dressed: No Getting to the toilet? No Using the toilet:No Moving around from place to place: No In the past year have you fallen or had a near fall?:No   Are you sexually active?  No  Do you have more than one partner?  No  Hearing Difficulties: No Do you often ask people to speak up or repeat themselves? No Do you experience ringing or noises in your ears? No Do you have difficulty understanding soft or whispered voices? No   Do you feel that you have a problem with memory? yes  Do you often misplace items? No  Do you feel safe at home?  Yes  Cognitive Testing  Alert? Yes  Normal Appearance?Yes  Oriented to person? Yes  Place? Yes   Time? Yes  Recall of three objects?  Yes  Can perform simple calculations? Yes  Displays appropriate judgment?yes Can read the correct time from a watch face?No   Advanced Directives have been discussed with the patient? Yes  List the Names of Other Physician/Practitioners you currently use: 1.  optho-- [place] 2.  Dentist---[place]  3.  DErm---[place] Indicate any recent Medical Services you may have received from other than [facility] providers in the past year (date may be approximate).  Immunization History  Administered Date(s) Administered  . Influenza Split 12/02/2010  . Influenza Whole 01/03/2007, 12/11/2007, 12/03/2008, 12/08/2009  . Pneumococcal Polysaccharide 07/19/2005  . Zoster 02/19/2010    Screening Tests Health Maintenance  Topic Date Due  . Tetanus/tdap  03/28/1949  . Influenza Vaccine  11/22/2011  . Colonoscopy  01/27/2019  . Pneumococcal Polysaccharide Vaccine Age 59 And Over  Completed  . Zostavax  Completed    All answers were reviewed with the patient and necessary referrals were made:  Loreen Freud,  DO   01/31/2011   History reviewed: allergies, current medications, past family history, past medical history, past social history, past surgical history and problem list  Review of Systems Review of Systems  Constitutional: Negative for activity change, appetite change and  fatigue.  HENT: Negative for hearing loss, congestion, tinnitus and ear discharge.  dentist q35m Eyes: Negative for visual disturbance (see optho q1y -- vision corrected to 20/20 with glasses).  Respiratory: Negative for cough, chest tightness and shortness of breath.   Cardiovascular: Negative for chest pain, palpitations and leg swelling.  Gastrointestinal: Negative for abdominal pain, diarrhea, constipation and abdominal distention.  Genitourinary: Negative for urgency, frequency, decreased urine volume and difficulty urinating.  Musculoskeletal: Negative for back pain, arthralgias and gait problem.  Skin: Negative for color change, pallor and rash.  Neurological: Negative for dizziness, light-headedness, numbness and headaches.  Hematological: Negative for adenopathy. Does not bruise/bleed easily.  Psychiatric/Behavioral: Negative for suicidal ideas, confusion, sleep disturbance, self-injury, dysphoric mood, decreased concentration and agitation.       Objective:     Vision by Snellen chart: per optho  Body mass index is 22.98 kg/(m^2). BP 116/70  Pulse 94  Temp(Src) 97.6 F (36.4 C) (Oral)  Ht 4' 10.75" (1.492 m)  Wt 112 lb 12.8 oz (51.166 kg)  BMI 22.98 kg/m2  SpO2 94%  BP 116/70  Pulse 94  Temp(Src) 97.6 F (36.4 C) (Oral)  Ht 4' 10.75" (1.492 m)  Wt 112 lb 12.8 oz (51.166 kg)  BMI 22.98 kg/m2  SpO2 94% General appearance: alert, cooperative, appears stated age and no distress Head: Normocephalic, without obvious abnormality, atraumatic Eyes: conjunctivae/corneas clear. PERRL, EOM's intact. Fundi benign. Ears: normal TM's and external ear canals both ears Nose: Nares normal. Septum midline.  Mucosa normal. No drainage or sinus tenderness. Throat: lips, mucosa, and tongue normal; teeth and gums normal Neck: no adenopathy, no carotid bruit, no JVD, supple, symmetrical, trachea midline and thyroid not enlarged, symmetric, no tenderness/mass/nodules Back: symmetric, no curvature. ROM normal. No CVA tenderness. Lungs: clear to auscultation bilaterally Breasts: normal appearance, no masses or tenderness Heart: regular rate and rhythm, S1, S2 normal, no murmur, click, rub or gallop Abdomen: soft, non-tender; bowel sounds normal; no masses,  no organomegaly Pelvic: not indicated; post-menopausal, no abnormal Pap smears in past Extremities: extremities normal, atraumatic, no cyanosis or edema Pulses: 2+ and symmetric Skin: Skin color, texture, turgor normal. No rashes or lesions Lymph nodes: Cervical, supraclavicular, and axillary nodes normal. Neurologic: Alert and oriented X 3, normal strength and tone. Normal symmetric reflexes. Normal coordination and gait Psych--- no anxiety / depression     Assessment:     cpe Osteoporosis htn Hyperlipidemia      Plan:     During the course of the visit the patient was educated and counseled about appropriate screening and preventive services including:    Pneumococcal vaccine   Influenza vaccine  Td vaccine  Screening mammography  Bone densitometry screening  Colorectal cancer screening  Advanced directives: has an advanced directive - a copy HAS NOT been provided.  Diet review for nutrition referral? Yes ____  Not Indicated __x__   Patient Instructions (the written plan) was given to the patient.  Medicare Attestation I have personally reviewed: The patient's medical and social history Their use of alcohol, tobacco or illicit drugs Their current medications and supplements The patient's functional ability including ADLs,fall risks, home safety risks, cognitive, and hearing and visual impairment Diet and physical  activities Evidence for depression or mood disorders  The patient's weight, height, BMI, and visual acuity have been recorded in the chart.  I have made referrals, counseling, and provided education to the patient based on review of the above and I have provided the patient with a written  personalized care plan for preventive services.     Loreen Freud, DO   01/31/2011

## 2011-01-31 NOTE — Assessment & Plan Note (Signed)
con't meds  check labs 

## 2011-01-31 NOTE — Patient Instructions (Signed)

## 2011-01-31 NOTE — Assessment & Plan Note (Signed)
Pt thinks she is having side effects to prolia and would like to try reclast

## 2011-01-31 NOTE — Assessment & Plan Note (Signed)
Stable con't meds 

## 2011-02-03 ENCOUNTER — Encounter: Payer: Self-pay | Admitting: Family Medicine

## 2011-02-07 ENCOUNTER — Other Ambulatory Visit: Payer: Self-pay | Admitting: Family Medicine

## 2011-02-07 DIAGNOSIS — M810 Age-related osteoporosis without current pathological fracture: Secondary | ICD-10-CM

## 2011-02-07 DIAGNOSIS — F329 Major depressive disorder, single episode, unspecified: Secondary | ICD-10-CM

## 2011-02-07 MED ORDER — IMIPRAMINE HCL 25 MG PO TABS: ORAL_TABLET | ORAL | Status: DC

## 2011-02-07 MED ORDER — DENOSUMAB 60 MG/ML ~~LOC~~ SOLN: 60.0000 mg | Freq: Once | SUBCUTANEOUS | Status: DC

## 2011-02-07 NOTE — Telephone Encounter (Signed)
refaxed    KP

## 2011-02-09 ENCOUNTER — Telehealth: Payer: Self-pay

## 2011-02-09 DIAGNOSIS — I10 Essential (primary) hypertension: Secondary | ICD-10-CM

## 2011-02-09 MED ORDER — ATENOLOL 25 MG PO TABS: ORAL_TABLET | ORAL | Status: DC

## 2011-02-09 NOTE — Telephone Encounter (Signed)
Faxed.   KP 

## 2011-02-10 ENCOUNTER — Other Ambulatory Visit: Payer: Self-pay

## 2011-02-10 DIAGNOSIS — F329 Major depressive disorder, single episode, unspecified: Secondary | ICD-10-CM

## 2011-02-10 MED ORDER — IMIPRAMINE HCL 25 MG PO TABS: ORAL_TABLET | ORAL | Status: DC

## 2011-02-14 ENCOUNTER — Encounter: Payer: Self-pay | Admitting: Family Medicine

## 2011-03-01 ENCOUNTER — Other Ambulatory Visit: Payer: Self-pay | Admitting: Family Medicine

## 2011-03-01 DIAGNOSIS — I10 Essential (primary) hypertension: Secondary | ICD-10-CM

## 2011-03-01 MED ORDER — ATENOLOL 25 MG PO TABS: ORAL_TABLET | ORAL | Status: DC

## 2011-03-01 NOTE — Telephone Encounter (Signed)
Cpe done-Rx faxed    KP

## 2011-03-11 MED ORDER — ATENOLOL 25 MG PO TABS: ORAL_TABLET | ORAL | Status: DC

## 2011-03-11 NOTE — Telephone Encounter (Signed)
Addended by: Arnette Norris on: 03/11/2011 12:04 PM   Modules accepted: Orders

## 2011-03-11 NOTE — Telephone Encounter (Signed)
Please resend rx for Atenolol Tablet 25mg 

## 2011-03-24 ENCOUNTER — Telehealth: Payer: Self-pay

## 2011-03-24 NOTE — Telephone Encounter (Signed)
Called patient to make her aware her benefits are covered for Reclast and she stated she wanted to get some more information on the Reclast before she decided to get the infusion. I mailed a Reclast information and patient agreed to call back with her decision. Last Prolia injection was 09/28/10.    KP

## 2011-03-31 ENCOUNTER — Telehealth: Payer: Self-pay

## 2011-03-31 NOTE — Telephone Encounter (Signed)
Call from patient and she stated she received the Reclast information and she decided she wanted to keep doing the Prolia injections, I advised I would send her insurance verification in and then call with her benefits information and get her scheduled and she agreed.    Kp

## 2011-04-19 ENCOUNTER — Ambulatory Visit (INDEPENDENT_AMBULATORY_CARE_PROVIDER_SITE_OTHER): Payer: Medicare Other | Admitting: *Deleted

## 2011-04-19 DIAGNOSIS — M810 Age-related osteoporosis without current pathological fracture: Secondary | ICD-10-CM

## 2011-04-19 MED ORDER — DENOSUMAB 60 MG/ML ~~LOC~~ SOLN
60.0000 mg | Freq: Once | SUBCUTANEOUS | Status: AC
  Administered 2011-04-19: 60 mg via SUBCUTANEOUS

## 2011-05-11 ENCOUNTER — Other Ambulatory Visit: Payer: Self-pay | Admitting: Family Medicine

## 2011-05-11 NOTE — Telephone Encounter (Signed)
Last seen and filled 01/31/11 # 30 with 2 refills. Please advise     KP

## 2011-06-09 ENCOUNTER — Other Ambulatory Visit: Payer: Self-pay | Admitting: *Deleted

## 2011-06-09 DIAGNOSIS — G4700 Insomnia, unspecified: Secondary | ICD-10-CM

## 2011-06-09 MED ORDER — ZOLPIDEM TARTRATE 10 MG PO TABS: ORAL_TABLET | ORAL | Status: DC

## 2011-06-09 NOTE — Telephone Encounter (Signed)
Ok for #30, no refills 

## 2011-06-09 NOTE — Telephone Encounter (Signed)
.  rx faxed to pharmacy, manually, called pt to advise, pt understood

## 2011-06-09 NOTE — Telephone Encounter (Signed)
Pt left vm stating she needs a refill on her Ambien, noted last OV and refill noted on 01-31-11 for #30 with 0 refills, please advise

## 2011-08-05 ENCOUNTER — Other Ambulatory Visit: Payer: Self-pay | Admitting: Family Medicine

## 2011-08-05 DIAGNOSIS — G4700 Insomnia, unspecified: Secondary | ICD-10-CM

## 2011-08-05 MED ORDER — ZOLPIDEM TARTRATE 10 MG PO TABS: ORAL_TABLET | ORAL | Status: DC

## 2011-08-05 NOTE — Telephone Encounter (Signed)
Pt due for ov now

## 2011-08-05 NOTE — Telephone Encounter (Signed)
Last seen 01/31/11 and xanax filled 05/11/11 and ambien 06/09/11. Please advise    KP

## 2011-08-16 ENCOUNTER — Encounter: Payer: Self-pay | Admitting: Family Medicine

## 2011-08-16 ENCOUNTER — Ambulatory Visit (INDEPENDENT_AMBULATORY_CARE_PROVIDER_SITE_OTHER): Payer: Medicare Other | Admitting: Family Medicine

## 2011-08-16 ENCOUNTER — Telehealth: Payer: Self-pay | Admitting: Family Medicine

## 2011-08-16 VITALS — BP 116/74 | HR 97 | Temp 98.7°F | Wt 110.6 lb

## 2011-08-16 DIAGNOSIS — N9489 Other specified conditions associated with female genital organs and menstrual cycle: Secondary | ICD-10-CM

## 2011-08-16 DIAGNOSIS — N898 Other specified noninflammatory disorders of vagina: Secondary | ICD-10-CM

## 2011-08-16 DIAGNOSIS — I10 Essential (primary) hypertension: Secondary | ICD-10-CM

## 2011-08-16 DIAGNOSIS — E785 Hyperlipidemia, unspecified: Secondary | ICD-10-CM

## 2011-08-16 LAB — POCT URINALYSIS DIPSTICK
Leukocytes, UA: NEGATIVE
Protein, UA: NEGATIVE
Spec Grav, UA: <=1.005
Urobilinogen, UA: 0.2
pH, UA: 7.5

## 2011-08-16 LAB — HEPATIC FUNCTION PANEL
AST: 24 U/L (ref 0–37)
Alkaline Phosphatase: 40 U/L (ref 39–117)
Total Bilirubin: 0.6 mg/dL (ref 0.3–1.2)

## 2011-08-16 LAB — LIPID PANEL
HDL: 53.4 mg/dL (ref >39.00)
LDL Cholesterol: 82 mg/dL (ref 0–99)
VLDL: 18.6 mg/dL (ref 0.0–40.0)

## 2011-08-16 LAB — BASIC METABOLIC PANEL
Calcium: 8.8 mg/dL (ref 8.4–10.5)
GFR: 96.79 mL/min (ref >60.00)
Potassium: 3.9 mEq/L (ref 3.5–5.1)
Sodium: 133 mEq/L — ABNORMAL LOW (ref 135–145)

## 2011-08-16 MED ORDER — ESTROGENS, CONJUGATED 0.625 MG/GM VA CREA: TOPICAL_CREAM | Freq: Every day | VAGINAL | Status: DC

## 2011-08-16 MED ORDER — ESTROGENS, CONJUGATED 0.625 MG/GM VA CREA: TOPICAL_CREAM | Freq: Every day | VAGINAL | Status: DC | PRN

## 2011-08-16 NOTE — Patient Instructions (Signed)

## 2011-08-16 NOTE — Telephone Encounter (Signed)
Per ax recv. Today from [facility] rs sent for conjugated estrogens (premarin) vaginal cream must have specific directions for use for insurance purposes. Please amend and res-send

## 2011-08-16 NOTE — Telephone Encounter (Signed)
Please advise      KP 

## 2011-08-16 NOTE — Telephone Encounter (Signed)
Re-faxed Rx and made the patient aware. She voiced understanding   KP

## 2011-08-16 NOTE — Telephone Encounter (Signed)
Premarin cream 1 g pv qd     -------------inform pt to use as little as possible to get relief

## 2011-08-16 NOTE — Progress Notes (Signed)
  Subjective:    Patient here for follow-up of elevated blood pressure.  She is exercising and is adherent to a low-salt diet.  Blood pressure is not checked at home. Cardiac symptoms: none. Patient denies: chest pain, chest pressure/discomfort, claudication, dyspnea, exertional chest pressure/discomfort, fatigue, irregular heart beat, lower extremity edema, near-syncope, orthopnea, palpitations, paroxysmal nocturnal dyspnea, syncope and tachypnea. Cardiovascular risk factors: advanced age (older than 60 for men, 22 for women), dyslipidemia and hypertension. Use of agents associated with hypertension: none. History of target organ damage: none.  The following portions of the patient's history were reviewed and updated as appropriate: allergies, current medications, past family history, past medical history, past social history, past surgical history and problem list.  Review of Systems Pertinent items are noted in HPI.     Objective:    BP 116/74  Pulse 97  Temp 98.7 F (37.1 C) (Oral)  Wt 110 lb 9.6 oz (50.168 kg)  SpO2 97% General appearance: alert, cooperative, appears stated age and no distress Lungs: clear to auscultation bilaterally Heart: S1, S2 normal Abdomen: soft, non-tender; bowel sounds normal; no masses,  no organomegaly    Assessment:    Hypertension, normal blood pressure . Evidence of target organ damage: none.   hyperlipidemia  dec hearing -- doesn't want to wear her hearing aids Vaginal dryness-- pt had estrogen cream in past and would like it back Plan:    Medication: no change. Dietary sodium restriction. Regular aerobic exercise. Check blood pressures 2-3 times weekly and record. Follow up: 6 months and as needed.

## 2011-08-17 ENCOUNTER — Encounter: Payer: Self-pay | Admitting: *Deleted

## 2011-09-01 ENCOUNTER — Other Ambulatory Visit: Payer: Self-pay

## 2011-09-01 MED ORDER — ALPRAZOLAM 0.5 MG PO TABS: 0.5000 mg | ORAL_TABLET | Freq: Every evening | ORAL | Status: DC | PRN

## 2011-09-01 NOTE — Telephone Encounter (Signed)
last seen 08/16/11 and filled 08/05/11 # 30. Please advise    KP

## 2011-09-30 ENCOUNTER — Other Ambulatory Visit: Payer: Self-pay

## 2011-09-30 MED ORDER — ALPRAZOLAM 0.5 MG PO TABS: 0.5000 mg | ORAL_TABLET | Freq: Every evening | ORAL | Status: DC | PRN

## 2011-09-30 NOTE — Telephone Encounter (Signed)
Last seen 08/16/11 and filled 09/01/11. Please advise    KP   FYI-----I Made patient aware I am verifying benefits for Prolia and she stated she did not want to get it yet, she stated she would rather wait until another time. I will verify the benefits anyway so I can offer it to the patient in a month or so.     KP

## 2011-10-26 ENCOUNTER — Other Ambulatory Visit: Payer: Self-pay | Admitting: Family Medicine

## 2011-10-26 DIAGNOSIS — I10 Essential (primary) hypertension: Secondary | ICD-10-CM

## 2011-10-26 DIAGNOSIS — F329 Major depressive disorder, single episode, unspecified: Secondary | ICD-10-CM

## 2011-10-26 MED ORDER — IMIPRAMINE HCL 25 MG PO TABS: ORAL_TABLET | ORAL | Status: DC

## 2011-10-26 MED ORDER — ATENOLOL 25 MG PO TABS: ORAL_TABLET | ORAL | Status: DC

## 2011-10-26 NOTE — Telephone Encounter (Signed)
Refills x 2  Last ov 6.25.13 44-month follow up   1-Imipramine HCl (Tab) 25 MG 3 tablets at bedtime Last wrt 12.20.12 #270 3-refills   2-Atenolol (Tab) 25 MG take 1 by mouth daily  Last wrt 1.18.13 90 wt/3-refills

## 2011-10-28 ENCOUNTER — Other Ambulatory Visit: Payer: Self-pay | Admitting: Family Medicine

## 2011-10-28 NOTE — Telephone Encounter (Signed)
Last seen 08/16/11 and Ambien 08/05/11 #30 and Xanax 09/30/11 # 30. Please advise      KP

## 2011-11-24 ENCOUNTER — Other Ambulatory Visit: Payer: Self-pay | Admitting: Family Medicine

## 2011-11-24 NOTE — Telephone Encounter (Signed)
Need okay.   MW  

## 2011-11-24 NOTE — Telephone Encounter (Signed)
Called left message pt Rx ready for pick up.      MW

## 2011-11-25 ENCOUNTER — Other Ambulatory Visit: Payer: Self-pay | Admitting: Family Medicine

## 2011-11-25 ENCOUNTER — Other Ambulatory Visit: Payer: Self-pay

## 2011-11-25 MED ORDER — ALPRAZOLAM 0.5 MG PO TABS: 0.5000 mg | ORAL_TABLET | Freq: Every evening | ORAL | Status: DC | PRN

## 2011-11-25 NOTE — Telephone Encounter (Signed)
Rx faxed manually yesterday, will refax      KP

## 2011-11-25 NOTE — Telephone Encounter (Signed)
msg from patient stating her Rx was not received by the pharmacy. I could not find the hard copy so I reprinted the Rx and sent it again manually       KP

## 2011-12-13 ENCOUNTER — Ambulatory Visit (INDEPENDENT_AMBULATORY_CARE_PROVIDER_SITE_OTHER): Payer: Medicare Other

## 2011-12-13 ENCOUNTER — Ambulatory Visit: Payer: Medicare Other

## 2011-12-13 DIAGNOSIS — Z23 Encounter for immunization: Secondary | ICD-10-CM

## 2012-01-23 ENCOUNTER — Other Ambulatory Visit: Payer: Self-pay | Admitting: Family Medicine

## 2012-01-24 ENCOUNTER — Other Ambulatory Visit: Payer: Self-pay | Admitting: Family Medicine

## 2012-01-24 NOTE — Telephone Encounter (Signed)
OV 08/16/11 last filled 11/25/11 # 30 x 1.     PLz advise     MW

## 2012-01-24 NOTE — Telephone Encounter (Signed)
Rx printed and faxed.   MW

## 2012-01-24 NOTE — Telephone Encounter (Signed)
OK #30 

## 2012-02-01 ENCOUNTER — Ambulatory Visit (INDEPENDENT_AMBULATORY_CARE_PROVIDER_SITE_OTHER): Payer: Medicare Other | Admitting: Family Medicine

## 2012-02-01 ENCOUNTER — Encounter: Payer: Self-pay | Admitting: Family Medicine

## 2012-02-01 VITALS — BP 110/60 | HR 85 | Temp 97.8°F | Ht 59.5 in | Wt 108.4 lb

## 2012-02-01 DIAGNOSIS — I10 Essential (primary) hypertension: Secondary | ICD-10-CM

## 2012-02-01 DIAGNOSIS — Z0000 Encounter for general adult medical examination without abnormal findings: Secondary | ICD-10-CM

## 2012-02-01 DIAGNOSIS — F329 Major depressive disorder, single episode, unspecified: Secondary | ICD-10-CM

## 2012-02-01 DIAGNOSIS — G4700 Insomnia, unspecified: Secondary | ICD-10-CM

## 2012-02-01 DIAGNOSIS — E785 Hyperlipidemia, unspecified: Secondary | ICD-10-CM

## 2012-02-01 LAB — CBC WITH DIFFERENTIAL/PLATELET
Basophils Absolute: 0 10*3/uL (ref 0.0–0.1)
Eosinophils Absolute: 0.1 10*3/uL (ref 0.0–0.7)
HCT: 36.3 % (ref 36.0–46.0)
Hemoglobin: 12.3 g/dL (ref 12.0–15.0)
Lymphs Abs: 1.9 10*3/uL (ref 0.7–4.0)
MCHC: 33.8 g/dL (ref 30.0–36.0)
Neutro Abs: 2.6 10*3/uL (ref 1.4–7.7)
RDW: 12.9 % (ref 11.5–14.6)

## 2012-02-01 LAB — BASIC METABOLIC PANEL
CO2: 30 mEq/L (ref 19–32)
Chloride: 100 mEq/L (ref 96–112)
Glucose, Bld: 101 mg/dL — ABNORMAL HIGH (ref 70–99)
Potassium: 3.7 mEq/L (ref 3.5–5.1)
Sodium: 137 mEq/L (ref 135–145)

## 2012-02-01 LAB — LIPID PANEL
Cholesterol: 176 mg/dL (ref 0–200)
HDL: 44.9 mg/dL (ref >39.00)
VLDL: 24.8 mg/dL (ref 0.0–40.0)

## 2012-02-01 LAB — HEPATIC FUNCTION PANEL
AST: 23 U/L (ref 0–37)
Albumin: 4.2 g/dL (ref 3.5–5.2)
Total Protein: 7.6 g/dL (ref 6.0–8.3)

## 2012-02-01 LAB — POCT URINALYSIS DIPSTICK
Bilirubin, UA: NEGATIVE
Ketones, UA: NEGATIVE
Protein, UA: NEGATIVE
Spec Grav, UA: 1.01

## 2012-02-01 MED ORDER — ATENOLOL 25 MG PO TABS: ORAL_TABLET | ORAL | Status: DC

## 2012-02-01 MED ORDER — IMIPRAMINE HCL 25 MG PO TABS: ORAL_TABLET | ORAL | Status: DC

## 2012-02-01 MED ORDER — ATORVASTATIN CALCIUM 20 MG PO TABS
20.0000 mg | ORAL_TABLET | Freq: Every day | ORAL | Status: DC
Start: 2012-02-01 — End: 2012-02-14

## 2012-02-01 MED ORDER — ZOLPIDEM TARTRATE 5 MG PO TABS: 5.0000 mg | ORAL_TABLET | Freq: Every evening | ORAL | Status: DC | PRN

## 2012-02-01 NOTE — Progress Notes (Signed)
Subjective:    Beth Collier is a 78 y.o. female who presents for Medicare Annual/Subsequent preventive examination.  Preventive Screening-Counseling & Management  Tobacco History  Smoking status  . Never Smoker   Smokeless tobacco  . Not on file     Problems Prior to Visit 1.   Current Problems (verified) Patient Active Problem List  Diagnosis  . HYPERLIPIDEMIA  . ANXIETY  . DEPRESSION  . OTITIS MEDIA, SEROUS, LEFT  . LOSS, HEARING NOS  . HYPERTENSION  . URI  . LOC OSTEOARTHROS NOT SPEC PRIM/SEC UNSPEC SITE  . OSTEOPOROSIS  . MALAISE AND FATIGUE  . TACHYCARDIA, HX OF    Medications Prior to Visit Current Outpatient Prescriptions on File Prior to Visit  Medication Sig Dispense Refill  . ALPRAZolam (XANAX) 0.5 MG tablet TAKE ONE TABLET BY MOUTH EVERY DAY AT BEDTIME AS NEEDED FOR SLEEP  30 tablet  0  . aspirin 81 MG EC tablet Take 81 mg by mouth daily.        Marland Kitchen atenolol (TENORMIN) 25 MG tablet 1 po qd  90 tablet  1  . atorvastatin (LIPITOR) 20 MG tablet Take 1 tablet (20 mg total) by mouth daily.  90 tablet  3  . CALCIUM PO Take by mouth. 1800 mg daily       . Cholecalciferol (VITAMIN D3) 1000 UNITS CAPS Take by mouth. 3 a day       . denosumab (PROLIA) 60 MG/ML SOLN injection Inject 60 mg into the skin once. Administer in upper arm, thigh, or abdomen  1 mL  0  . imipramine (TOFRANIL) 25 MG tablet 3 tablets po qhs  270 tablet  1  . loratadine (CLARITIN) 10 MG tablet Take 10 mg by mouth daily.        Marland Kitchen omeprazole (PRILOSEC OTC) 20 MG tablet Take 20 mg by mouth daily.        . pentazocine-acetaminophen (TALACEN) 25-650 MG TABS Take 1 tablet by mouth 3 (three) times daily.      Marland Kitchen zolpidem (AMBIEN) 10 MG tablet TAKE ONE TABLET BY MOUTH AT BEDTIME AS NEEDED  30 tablet  0    Current Medications (verified) Current Outpatient Prescriptions  Medication Sig Dispense Refill  . ALPRAZolam (XANAX) 0.5 MG tablet TAKE ONE TABLET BY MOUTH EVERY DAY AT BEDTIME AS NEEDED FOR SLEEP   30 tablet  0  . aspirin 81 MG EC tablet Take 81 mg by mouth daily.        Marland Kitchen atenolol (TENORMIN) 25 MG tablet 1 po qd  90 tablet  1  . atorvastatin (LIPITOR) 20 MG tablet Take 1 tablet (20 mg total) by mouth daily.  90 tablet  3  . CALCIUM PO Take by mouth. 1800 mg daily       . Cholecalciferol (VITAMIN D3) 1000 UNITS CAPS Take by mouth. 3 a day       . denosumab (PROLIA) 60 MG/ML SOLN injection Inject 60 mg into the skin once. Administer in upper arm, thigh, or abdomen  1 mL  0  . imipramine (TOFRANIL) 25 MG tablet 3 tablets po qhs  270 tablet  1  . loratadine (CLARITIN) 10 MG tablet Take 10 mg by mouth daily.        Marland Kitchen omeprazole (PRILOSEC OTC) 20 MG tablet Take 20 mg by mouth daily.        . pentazocine-acetaminophen (TALACEN) 25-650 MG TABS Take 1 tablet by mouth 3 (three) times daily.      Marland Kitchen  zolpidem (AMBIEN) 10 MG tablet TAKE ONE TABLET BY MOUTH AT BEDTIME AS NEEDED  30 tablet  0     Allergies (verified) Cephalexin; Clindamycin; Codeine; Hydrocodone-acetaminophen; Meperidine hcl; and Penicillins   PAST HISTORY  Family History Family History  Problem Relation Age of Onset  . Breast cancer      <50  . Ovarian cancer    . Coronary artery disease      <60  . Depression    . Arthritis    . Alcohol abuse    . Osteoporosis Mother   . Alzheimer's disease Mother   . Mental illness Mother     alzheimers  . Celiac disease Father   . Cancer Other     breast, ovarian    Social History History  Substance Use Topics  . Smoking status: Never Smoker   . Smokeless tobacco: Not on file  . Alcohol Use: No     Are there smokers in your home (other than you)? No  Risk Factors Current exercise habits: tai chi,   walk Dietary issues discussed: na   Cardiac risk factors: advanced age (older than 3 for men, 4 for women), dyslipidemia and hypertension.  Depression Screen (Note: if answer to either of the following is "Yes", a more complete depression screening is indicated)    Over the past two weeks, have you felt down, depressed or hopeless? No  Over the past two weeks, have you felt little interest or pleasure in doing things? No  Have you lost interest or pleasure in daily life? No  Do you often feel hopeless? No  Do you cry easily over simple problems? No  Activities of Daily Living In your present state of health, do you have any difficulty performing the following activities?:  Driving? No Managing money?  No Feeding yourself? No Getting from bed to chair? No Climbing a flight of stairs? No Preparing food and eating?: No Bathing or showering? No Getting dressed: No Getting to the toilet? No Using the toilet:No Moving around from place to place: No In the past year have you fallen or had a near fall?:No   Are you sexually active?  No  Do you have more than one partner?  No  Hearing Difficulties: Yes Do you often ask people to speak up or repeat themselves? Yes Do you experience ringing or noises in your ears? No Do you have difficulty understanding soft or whispered voices? Yes   Do you feel that you have a problem with memory? No  Do you often misplace items? No  Do you feel safe at home?  Yes  Cognitive Testing  Alert? Yes  Normal Appearance?Yes  Oriented to person? Yes  Place? Yes   Time? Yes  Recall of three objects?  Yes  Can perform simple calculations? Yes  Displays appropriate judgment?Yes  Can read the correct time from a watch face?Yes   Advanced Directives have been discussed with the patient? Yes  List the Names of Other Physician/Practitioners you currently use: 1.  Ortho-- alusio, ramos 2   opth-- [place] 3  Dentist- [place] 4 derm-- gould, gruber Indicate any recent Medical Services you may have received from other than [facility] providers in the past year (date may be approximate).  Immunization History  Administered Date(s) Administered  . Influenza Split 12/02/2010, 12/13/2011  . Influenza Whole 01/03/2007,  12/11/2007, 12/03/2008, 12/08/2009  . Pneumococcal Polysaccharide 07/19/2005  . Zoster 02/19/2010    Screening Tests Health Maintenance  Topic Date Due  .  Tetanus/tdap  03/28/1949  . Mammogram  01/17/2012  . Influenza Vaccine  10/22/2012  . Colonoscopy  01/27/2019  . Pneumococcal Polysaccharide Vaccine Age 52 And Over  Completed  . Zostavax  Completed    All answers were reviewed with the patient and necessary referrals were made:  Loreen Freud, DO   02/01/2012   History reviewed:  She  has a past medical history of Anxiety; Depression; Osteoporosis; Hyperlipidemia; and Hypertension. She  does not have any pertinent problems on file. She  has past surgical history that includes Lumbar laminectomy. Her family history includes Alcohol abuse in an unspecified family member; Alzheimer's disease in her mother; Arthritis in an unspecified family member; Breast cancer in an unspecified family member; Cancer in her other; Celiac disease in her father; Coronary artery disease in an unspecified family member; Depression in an unspecified family member; Mental illness in her mother; Osteoporosis in her mother; and Ovarian cancer in an unspecified family member. She  reports that she has never smoked. She does not have any smokeless tobacco history on file. She reports that she does not drink alcohol or use illicit drugs. She has a current medication list which includes the following prescription(s): alprazolam, aspirin, atenolol, atorvastatin, calcium, vitamin d3, denosumab, imipramine, loratadine, omeprazole, pentazocine-acetaminophen, and zolpidem. Current Outpatient Prescriptions on File Prior to Visit  Medication Sig Dispense Refill  . ALPRAZolam (XANAX) 0.5 MG tablet TAKE ONE TABLET BY MOUTH EVERY DAY AT BEDTIME AS NEEDED FOR SLEEP  30 tablet  0  . aspirin 81 MG EC tablet Take 81 mg by mouth daily.        Marland Kitchen atenolol (TENORMIN) 25 MG tablet 1 po qd  90 tablet  1  . atorvastatin (LIPITOR) 20 MG  tablet Take 1 tablet (20 mg total) by mouth daily.  90 tablet  3  . CALCIUM PO Take by mouth. 1800 mg daily       . Cholecalciferol (VITAMIN D3) 1000 UNITS CAPS Take by mouth. 3 a day       . denosumab (PROLIA) 60 MG/ML SOLN injection Inject 60 mg into the skin once. Administer in upper arm, thigh, or abdomen  1 mL  0  . imipramine (TOFRANIL) 25 MG tablet 3 tablets po qhs  270 tablet  1  . loratadine (CLARITIN) 10 MG tablet Take 10 mg by mouth daily.        Marland Kitchen omeprazole (PRILOSEC OTC) 20 MG tablet Take 20 mg by mouth daily.        . pentazocine-acetaminophen (TALACEN) 25-650 MG TABS Take 1 tablet by mouth 3 (three) times daily.      Marland Kitchen zolpidem (AMBIEN) 10 MG tablet TAKE ONE TABLET BY MOUTH AT BEDTIME AS NEEDED  30 tablet  0   She is allergic to cephalexin; clindamycin; codeine; hydrocodone-acetaminophen; meperidine hcl; and penicillins.  Review of Systems  Review of Systems  Constitutional: Negative for activity change, appetite change and fatigue.  HENT:+ for hearing loss,  Neg congestion, tinnitus and ear discharge.   Eyes: Negative for visual disturbance (see optho q1y -- vision corrected to 20/20 with glasses).  Respiratory: Negative for cough, chest tightness and shortness of breath.   Cardiovascular: Negative for chest pain, palpitations and leg swelling.  Gastrointestinal: Negative for abdominal pain, diarrhea, constipation and abdominal distention.  Genitourinary: Negative for urgency, frequency, decreased urine volume and difficulty urinating.  Musculoskeletal:positive for back pain, arthralgias and gait problem. + knee pain Skin: Negative for color change, pallor and rash.  Neurological: Negative  for dizziness, light-headedness, numbness and headaches.  Hematological: Negative for adenopathy. Does not bruise/bleed easily.  Psychiatric/Behavioral: Negative for suicidal ideas, confusion, sleep disturbance, self-injury, dysphoric mood, decreased concentration and agitation.  Pt is  able to read and write and can do all ADLs No risk for falling No abuse/ violence in home      Objective:     Vision by Snellen chart: opth  Body mass index is 21.53 kg/(m^2). BP 110/60  Pulse 85  Temp 97.8 F (36.6 C) (Oral)  Ht 4' 11.5" (1.511 m)  Wt 108 lb 6.4 oz (49.17 kg)  BMI 21.53 kg/m2  SpO2 95%  BP 110/60  Pulse 85  Temp 97.8 F (36.6 C) (Oral)  Ht 4' 11.5" (1.511 m)  Wt 108 lb 6.4 oz (49.17 kg)  BMI 21.53 kg/m2  SpO2 95% General appearance: alert, cooperative, appears stated age and no distress Head: Normocephalic, without obvious abnormality, atraumatic Eyes: negative findings: lids and lashes normal and pupils equal, round, reactive to light and accomodation Ears: normal TM's and external ear canals both ears Nose: Nares normal. Septum midline. Mucosa normal. No drainage or sinus tenderness. Throat: lips, mucosa, and tongue normal; teeth and gums normal Neck: no adenopathy, no carotid bruit, no JVD, supple, symmetrical, trachea midline and thyroid not enlarged, symmetric, no tenderness/mass/nodules Back: symmetric, no curvature. ROM normal. No CVA tenderness. Lungs: clear to auscultation bilaterally Breasts: normal appearance, no masses or tenderness Heart: regular rate and rhythm, S1, S2 normal, no murmur, click, rub or gallop Abdomen: soft, non-tender; bowel sounds normal; no masses,  no organomegaly Pelvic: not indicated; post-menopausal, no abnormal Pap smears in past Extremities: extremities normal, atraumatic, no cyanosis or edema Pulses: 2+ and symmetric Skin: Skin color, texture, turgor normal. No rashes or lesions Lymph nodes: Cervical, supraclavicular, and axillary nodes normal. Neurologic: Alert and oriented X 3, normal strength and tone. Normal symmetric reflexes. Normal coordination and gait Psych-- no depression, no anxiety     Assessment:    cpe     Plan:     During the course of the visit the patient was educated and counseled  about appropriate screening and preventive services including:    Pneumococcal vaccine   Influenza vaccine  Screening mammography  Bone densitometry screening  Colorectal cancer screening  Glaucoma screening  Advanced directives: has an advanced directive - a copy HAS NOT been provided.  Diet review for nutrition referral? Yes ____  Not Indicated _x___   Patient Instructions (the written plan) was given to the patient.  Medicare Attestation I have personally reviewed: The patient's medical and social history Their use of alcohol, tobacco or illicit drugs Their current medications and supplements The patient's functional ability including ADLs,fall risks, home safety risks, cognitive, and hearing and visual impairment Diet and physical activities Evidence for depression or mood disorders  The patient's weight, height, BMI, and visual acuity have been recorded in the chart.  I have made referrals, counseling, and provided education to the patient based on review of the above and I have provided the patient with a written personalized care plan for preventive services.     Loreen Freud, DO   02/01/2012

## 2012-02-01 NOTE — Assessment & Plan Note (Signed)
Check labs 

## 2012-02-01 NOTE — Patient Instructions (Addendum)
Preventive Care for Adults, Female A healthy lifestyle and preventive care can promote health and wellness. Preventive health guidelines for women include the following key practices.  A routine yearly physical is a good way to check with your caregiver about your health and preventive screening. It is a chance to share any concerns and updates on your health, and to receive a thorough exam.  Visit your dentist for a routine exam and preventive care every 6 months. Brush your teeth twice a day and floss once a day. Good oral hygiene prevents tooth decay and gum disease.  The frequency of eye exams is based on your age, health, family medical history, use of contact lenses, and other factors. Follow your caregiver's recommendations for frequency of eye exams.  Eat a healthy diet. Foods like vegetables, fruits, whole grains, low-fat dairy products, and lean protein foods contain the nutrients you need without too many calories. Decrease your intake of foods high in solid fats, added sugars, and salt. Eat the right amount of calories for you.Get information about a proper diet from your caregiver, if necessary.  Regular physical exercise is one of the most important things you can do for your health. Most adults should get at least 150 minutes of moderate-intensity exercise (any activity that increases your heart rate and causes you to sweat) each week. In addition, most adults need muscle-strengthening exercises on 2 or more days a week.  Maintain a healthy weight. The body mass index (BMI) is a screening tool to identify possible weight problems. It provides an estimate of body fat based on height and weight. Your caregiver can help determine your BMI, and can help you achieve or maintain a healthy weight.For adults 20 years and older:  A BMI below 18.5 is considered underweight.  A BMI of 18.5 to 24.9 is normal.  A BMI of 25 to 29.9 is considered overweight.  A BMI of 30 and above is  considered obese.  Maintain normal blood lipids and cholesterol levels by exercising and minimizing your intake of saturated fat. Eat a balanced diet with plenty of fruit and vegetables. Blood tests for lipids and cholesterol should begin at age 20 and be repeated every 5 years. If your lipid or cholesterol levels are high, you are over 50, or you are at high risk for heart disease, you may need your cholesterol levels checked more frequently.Ongoing high lipid and cholesterol levels should be treated with medicines if diet and exercise are not effective.  If you smoke, find out from your caregiver how to quit. If you do not use tobacco, do not start.  If you are pregnant, do not drink alcohol. If you are breastfeeding, be very cautious about drinking alcohol. If you are not pregnant and choose to drink alcohol, do not exceed 1 drink per day. One drink is considered to be 12 ounces (355 mL) of beer, 5 ounces (148 mL) of wine, or 1.5 ounces (44 mL) of liquor.  Avoid use of street drugs. Do not share needles with anyone. Ask for help if you need support or instructions about stopping the use of drugs.  High blood pressure causes heart disease and increases the risk of stroke. Your blood pressure should be checked at least every 1 to 2 years. Ongoing high blood pressure should be treated with medicines if weight loss and exercise are not effective.  If you are 55 to 76 years old, ask your caregiver if you should take aspirin to prevent strokes.  Diabetes   screening involves taking a blood sample to check your fasting blood sugar level. This should be done once every 3 years, after age 45, if you are within normal weight and without risk factors for diabetes. Testing should be considered at a younger age or be carried out more frequently if you are overweight and have at least 1 risk factor for diabetes.  Breast cancer screening is essential preventive care for women. You should practice "breast  self-awareness." This means understanding the normal appearance and feel of your breasts and may include breast self-examination. Any changes detected, no matter how small, should be reported to a caregiver. Women in their 20s and 30s should have a clinical breast exam (CBE) by a caregiver as part of a regular health exam every 1 to 3 years. After age 40, women should have a CBE every year. Starting at age 40, women should consider having a mammography (breast X-ray test) every year. Women who have a family history of breast cancer should talk to their caregiver about genetic screening. Women at a high risk of breast cancer should talk to their caregivers about having magnetic resonance imaging (MRI) and a mammography every year.  The Pap test is a screening test for cervical cancer. A Pap test can show cell changes on the cervix that might become cervical cancer if left untreated. A Pap test is a procedure in which cells are obtained and examined from the lower end of the uterus (cervix).  Women should have a Pap test starting at age 21.  Between ages 21 and 29, Pap tests should be repeated every 2 years.  Beginning at age 30, you should have a Pap test every 3 years as long as the past 3 Pap tests have been normal.  Some women have medical problems that increase the chance of getting cervical cancer. Talk to your caregiver about these problems. It is especially important to talk to your caregiver if a new problem develops soon after your last Pap test. In these cases, your caregiver may recommend more frequent screening and Pap tests.  The above recommendations are the same for women who have or have not gotten the vaccine for human papillomavirus (HPV).  If you had a hysterectomy for a problem that was not cancer or a condition that could lead to cancer, then you no longer need Pap tests. Even if you no longer need a Pap test, a regular exam is a good idea to make sure no other problems are  starting.  If you are between ages 65 and 70, and you have had normal Pap tests going back 10 years, you no longer need Pap tests. Even if you no longer need a Pap test, a regular exam is a good idea to make sure no other problems are starting.  If you have had past treatment for cervical cancer or a condition that could lead to cancer, you need Pap tests and screening for cancer for at least 20 years after your treatment.  If Pap tests have been discontinued, risk factors (such as a new sexual partner) need to be reassessed to determine if screening should be resumed.  The HPV test is an additional test that may be used for cervical cancer screening. The HPV test looks for the virus that can cause the cell changes on the cervix. The cells collected during the Pap test can be tested for HPV. The HPV test could be used to screen women aged 30 years and older, and should   be used in women of any age who have unclear Pap test results. After the age of 30, women should have HPV testing at the same frequency as a Pap test.  Colorectal cancer can be detected and often prevented. Most routine colorectal cancer screening begins at the age of 50 and continues through age 75. However, your caregiver may recommend screening at an earlier age if you have risk factors for colon cancer. On a yearly basis, your caregiver may provide home test kits to check for hidden blood in the stool. Use of a small camera at the end of a tube, to directly examine the colon (sigmoidoscopy or colonoscopy), can detect the earliest forms of colorectal cancer. Talk to your caregiver about this at age 50, when routine screening begins. Direct examination of the colon should be repeated every 5 to 10 years through age 75, unless early forms of pre-cancerous polyps or small growths are found.  Hepatitis C blood testing is recommended for all people born from 1945 through 1965 and any individual with known risks for hepatitis C.  Practice  safe sex. Use condoms and avoid high-risk sexual practices to reduce the spread of sexually transmitted infections (STIs). STIs include gonorrhea, chlamydia, syphilis, trichomonas, herpes, HPV, and human immunodeficiency virus (HIV). Herpes, HIV, and HPV are viral illnesses that have no cure. They can result in disability, cancer, and death. Sexually active women aged 25 and younger should be checked for chlamydia. Older women with new or multiple partners should also be tested for chlamydia. Testing for other STIs is recommended if you are sexually active and at increased risk.  Osteoporosis is a disease in which the bones lose minerals and strength with aging. This can result in serious bone fractures. The risk of osteoporosis can be identified using a bone density scan. Women ages 65 and over and women at risk for fractures or osteoporosis should discuss screening with their caregivers. Ask your caregiver whether you should take a calcium supplement or vitamin D to reduce the rate of osteoporosis.  Menopause can be associated with physical symptoms and risks. Hormone replacement therapy is available to decrease symptoms and risks. You should talk to your caregiver about whether hormone replacement therapy is right for you.  Use sunscreen with sun protection factor (SPF) of 30 or more. Apply sunscreen liberally and repeatedly throughout the day. You should seek shade when your shadow is shorter than you. Protect yourself by wearing long sleeves, pants, a wide-brimmed hat, and sunglasses year round, whenever you are outdoors.  Once a month, do a whole body skin exam, using a mirror to look at the skin on your back. Notify your caregiver of new moles, moles that have irregular borders, moles that are larger than a pencil eraser, or moles that have changed in shape or color.  Stay current with required immunizations.  Influenza. You need a dose every fall (or winter). The composition of the flu vaccine  changes each year, so being vaccinated once is not enough.  Pneumococcal polysaccharide. You need 1 to 2 doses if you smoke cigarettes or if you have certain chronic medical conditions. You need 1 dose at age 65 (or older) if you have never been vaccinated.  Tetanus, diphtheria, pertussis (Tdap, Td). Get 1 dose of Tdap vaccine if you are younger than age 65, are over 65 and have contact with an infant, are a healthcare worker, are pregnant, or simply want to be protected from whooping cough. After that, you need a Td   booster dose every 10 years. Consult your caregiver if you have not had at least 3 tetanus and diphtheria-containing shots sometime in your life or have a deep or dirty wound.  HPV. You need this vaccine if you are a woman age 26 or younger. The vaccine is given in 3 doses over 6 months.  Measles, mumps, rubella (MMR). You need at least 1 dose of MMR if you were born in 1957 or later. You may also need a second dose.  Meningococcal. If you are age 19 to 21 and a first-year college student living in a residence hall, or have one of several medical conditions, you need to get vaccinated against meningococcal disease. You may also need additional booster doses.  Zoster (shingles). If you are age 60 or older, you should get this vaccine.  Varicella (chickenpox). If you have never had chickenpox or you were vaccinated but received only 1 dose, talk to your caregiver to find out if you need this vaccine.  Hepatitis A. You need this vaccine if you have a specific risk factor for hepatitis A virus infection or you simply wish to be protected from this disease. The vaccine is usually given as 2 doses, 6 to 18 months apart.  Hepatitis B. You need this vaccine if you have a specific risk factor for hepatitis B virus infection or you simply wish to be protected from this disease. The vaccine is given in 3 doses, usually over 6 months. Preventive Services / Frequency Ages 19 to 39  Blood  pressure check.** / Every 1 to 2 years.  Lipid and cholesterol check.** / Every 5 years beginning at age 20.  Clinical breast exam.** / Every 3 years for women in their 20s and 30s.  Pap test.** / Every 2 years from ages 21 through 29. Every 3 years starting at age 30 through age 65 or 70 with a history of 3 consecutive normal Pap tests.  HPV screening.** / Every 3 years from ages 30 through ages 65 to 70 with a history of 3 consecutive normal Pap tests.  Hepatitis C blood test.** / For any individual with known risks for hepatitis C.  Skin self-exam. / Monthly.  Influenza immunization.** / Every year.  Pneumococcal polysaccharide immunization.** / 1 to 2 doses if you smoke cigarettes or if you have certain chronic medical conditions.  Tetanus, diphtheria, pertussis (Tdap, Td) immunization. / A one-time dose of Tdap vaccine. After that, you need a Td booster dose every 10 years.  HPV immunization. / 3 doses over 6 months, if you are 26 and younger.  Measles, mumps, rubella (MMR) immunization. / You need at least 1 dose of MMR if you were born in 1957 or later. You may also need a second dose.  Meningococcal immunization. / 1 dose if you are age 19 to 21 and a first-year college student living in a residence hall, or have one of several medical conditions, you need to get vaccinated against meningococcal disease. You may also need additional booster doses.  Varicella immunization.** / Consult your caregiver.  Hepatitis A immunization.** / Consult your caregiver. 2 doses, 6 to 18 months apart.  Hepatitis B immunization.** / Consult your caregiver. 3 doses usually over 6 months. Ages 40 to 64  Blood pressure check.** / Every 1 to 2 years.  Lipid and cholesterol check.** / Every 5 years beginning at age 20.  Clinical breast exam.** / Every year after age 40.  Mammogram.** / Every year beginning at age 40   and continuing for as long as you are in good health. Consult with your  caregiver.  Pap test.** / Every 3 years starting at age 30 through age 65 or 70 with a history of 3 consecutive normal Pap tests.  HPV screening.** / Every 3 years from ages 30 through ages 65 to 70 with a history of 3 consecutive normal Pap tests.  Fecal occult blood test (FOBT) of stool. / Every year beginning at age 50 and continuing until age 75. You may not need to do this test if you get a colonoscopy every 10 years.  Flexible sigmoidoscopy or colonoscopy.** / Every 5 years for a flexible sigmoidoscopy or every 10 years for a colonoscopy beginning at age 50 and continuing until age 75.  Hepatitis C blood test.** / For all people born from 1945 through 1965 and any individual with known risks for hepatitis C.  Skin self-exam. / Monthly.  Influenza immunization.** / Every year.  Pneumococcal polysaccharide immunization.** / 1 to 2 doses if you smoke cigarettes or if you have certain chronic medical conditions.  Tetanus, diphtheria, pertussis (Tdap, Td) immunization.** / A one-time dose of Tdap vaccine. After that, you need a Td booster dose every 10 years.  Measles, mumps, rubella (MMR) immunization. / You need at least 1 dose of MMR if you were born in 1957 or later. You may also need a second dose.  Varicella immunization.** / Consult your caregiver.  Meningococcal immunization.** / Consult your caregiver.  Hepatitis A immunization.** / Consult your caregiver. 2 doses, 6 to 18 months apart.  Hepatitis B immunization.** / Consult your caregiver. 3 doses, usually over 6 months. Ages 65 and over  Blood pressure check.** / Every 1 to 2 years.  Lipid and cholesterol check.** / Every 5 years beginning at age 20.  Clinical breast exam.** / Every year after age 40.  Mammogram.** / Every year beginning at age 40 and continuing for as long as you are in good health. Consult with your caregiver.  Pap test.** / Every 3 years starting at age 30 through age 65 or 70 with a 3  consecutive normal Pap tests. Testing can be stopped between 65 and 70 with 3 consecutive normal Pap tests and no abnormal Pap or HPV tests in the past 10 years.  HPV screening.** / Every 3 years from ages 30 through ages 65 or 70 with a history of 3 consecutive normal Pap tests. Testing can be stopped between 65 and 70 with 3 consecutive normal Pap tests and no abnormal Pap or HPV tests in the past 10 years.  Fecal occult blood test (FOBT) of stool. / Every year beginning at age 50 and continuing until age 75. You may not need to do this test if you get a colonoscopy every 10 years.  Flexible sigmoidoscopy or colonoscopy.** / Every 5 years for a flexible sigmoidoscopy or every 10 years for a colonoscopy beginning at age 50 and continuing until age 75.  Hepatitis C blood test.** / For all people born from 1945 through 1965 and any individual with known risks for hepatitis C.  Osteoporosis screening.** / A one-time screening for women ages 65 and over and women at risk for fractures or osteoporosis.  Skin self-exam. / Monthly.  Influenza immunization.** / Every year.  Pneumococcal polysaccharide immunization.** / 1 dose at age 65 (or older) if you have never been vaccinated.  Tetanus, diphtheria, pertussis (Tdap, Td) immunization. / A one-time dose of Tdap vaccine if you are over   65 and have contact with an infant, are a healthcare worker, or simply want to be protected from whooping cough. After that, you need a Td booster dose every 10 years.  Varicella immunization.** / Consult your caregiver.  Meningococcal immunization.** / Consult your caregiver.  Hepatitis A immunization.** / Consult your caregiver. 2 doses, 6 to 18 months apart.  Hepatitis B immunization.** / Check with your caregiver. 3 doses, usually over 6 months. ** Family history and personal history of risk and conditions may change your caregiver's recommendations. Document Released: 04/05/2001 Document Revised: 05/02/2011  Document Reviewed: 07/05/2010 [employer] Patient Information 2013 [employer].  

## 2012-02-01 NOTE — Assessment & Plan Note (Signed)
stable °

## 2012-02-14 ENCOUNTER — Telehealth: Payer: Self-pay

## 2012-02-14 DIAGNOSIS — E785 Hyperlipidemia, unspecified: Secondary | ICD-10-CM

## 2012-02-14 MED ORDER — ATORVASTATIN CALCIUM 40 MG PO TABS: 40.0000 mg | ORAL_TABLET | Freq: Every day | ORAL | Status: DC

## 2012-02-14 NOTE — Telephone Encounter (Signed)
Spoke with patient, patient verbalized understanding of lab results patient requested rx to be sent to mail order company. Copy of lab mailed

## 2012-02-14 NOTE — Telephone Encounter (Signed)
Message copied by Maurice Small on Tue Feb 14, 2012  1:01 PM ------      Message from: Lelon Perla      Created: Sat Feb 11, 2012  5:29 PM       Cholesterol--- LDL goal < 100,  HDL >40,  TG < 150.  Diet and exercise will increase HDL and decrease LDL and TG.  Fish,  Fish Oil, Flaxseed oil will also help increase the HDL and decrease Triglycerides.   Recheck labs in 3 months----- 272.4  Lipid, hep------inc lipitor to 40 mg  #30  1 po qhs,  2 refills.

## 2012-03-22 ENCOUNTER — Telehealth: Payer: Self-pay

## 2012-03-22 MED ORDER — ALPRAZOLAM 0.5 MG PO TABS: 0.5000 mg | ORAL_TABLET | Freq: Every evening | ORAL | Status: DC | PRN

## 2012-03-22 NOTE — Telephone Encounter (Signed)
Last seen 02/01/12 and filled 01/23/12 # 30. Please advise        KP

## 2012-03-22 NOTE — Telephone Encounter (Signed)
Refill x1   2 refills 

## 2012-03-26 ENCOUNTER — Other Ambulatory Visit: Payer: Self-pay | Admitting: Orthopedic Surgery

## 2012-03-26 MED ORDER — DEXAMETHASONE SODIUM PHOSPHATE 10 MG/ML IJ SOLN: 10.0000 mg | Freq: Once | INTRAMUSCULAR | Status: DC

## 2012-03-26 NOTE — Progress Notes (Signed)
Preoperative surgical orders have been place into the [facility] hospital system for Beth Collier on 03/26/2012, 7:21 AM  by Patrica Duel for surgery on 04/11/2012.  Preop Knee Scope orders including IV Tylenol and IV Decadron as long as there are no contraindications to the above medications. Avel Peace, PA-C

## 2012-03-30 ENCOUNTER — Encounter (HOSPITAL_COMMUNITY): Payer: Self-pay | Admitting: Pharmacy Technician

## 2012-04-04 ENCOUNTER — Inpatient Hospital Stay (HOSPITAL_COMMUNITY): Admission: RE | Admit: 2012-04-04 | Payer: Medicare Other | Source: Ambulatory Visit

## 2012-04-09 ENCOUNTER — Inpatient Hospital Stay (HOSPITAL_COMMUNITY): Admission: RE | Admit: 2012-04-09 | Payer: Medicare Other | Source: Ambulatory Visit

## 2012-04-11 ENCOUNTER — Encounter (HOSPITAL_COMMUNITY): Admission: RE | Payer: Self-pay | Source: Ambulatory Visit

## 2012-04-11 ENCOUNTER — Ambulatory Visit (HOSPITAL_COMMUNITY): Admission: RE | Admit: 2012-04-11 | Payer: Medicare Other | Source: Ambulatory Visit | Admitting: Orthopedic Surgery

## 2012-04-11 SURGERY — ARTHROSCOPY, KNEE
Anesthesia: Choice | Site: Knee | Laterality: Left

## 2012-04-19 ENCOUNTER — Encounter: Payer: Self-pay | Admitting: Family Medicine

## 2012-06-14 ENCOUNTER — Other Ambulatory Visit: Payer: Self-pay | Admitting: Family Medicine

## 2012-06-14 NOTE — Telephone Encounter (Signed)
last seen 02/01/12 and filled 03/22/12 #30 with 2 refills. Please advise     KP

## 2012-07-16 ENCOUNTER — Other Ambulatory Visit: Payer: Self-pay | Admitting: Family Medicine

## 2012-07-17 NOTE — Telephone Encounter (Signed)
Alprazolam refill request. Pt last seen on 02-01-12. Med last filled on 06-14-12 #30 0 refills. Ok to fill?

## 2012-07-31 ENCOUNTER — Ambulatory Visit (INDEPENDENT_AMBULATORY_CARE_PROVIDER_SITE_OTHER): Payer: Medicare Other | Admitting: Family Medicine

## 2012-07-31 ENCOUNTER — Encounter: Payer: Self-pay | Admitting: Family Medicine

## 2012-07-31 VITALS — BP 116/80 | HR 78 | Temp 98.3°F | Wt 107.4 lb

## 2012-07-31 DIAGNOSIS — E785 Hyperlipidemia, unspecified: Secondary | ICD-10-CM

## 2012-07-31 DIAGNOSIS — G4700 Insomnia, unspecified: Secondary | ICD-10-CM

## 2012-07-31 DIAGNOSIS — I10 Essential (primary) hypertension: Secondary | ICD-10-CM

## 2012-07-31 LAB — HEPATIC FUNCTION PANEL
AST: 26 U/L (ref 0–37)
Alkaline Phosphatase: 52 U/L (ref 39–117)
Bilirubin, Direct: 0.1 mg/dL (ref 0.0–0.3)
Total Bilirubin: 0.6 mg/dL (ref 0.3–1.2)

## 2012-07-31 LAB — BASIC METABOLIC PANEL
GFR: 93.13 mL/min (ref >60.00)
Potassium: 4.2 mEq/L (ref 3.5–5.1)
Sodium: 137 mEq/L (ref 135–145)

## 2012-07-31 LAB — LIPID PANEL
LDL Cholesterol: 88 mg/dL (ref 0–99)
VLDL: 21 mg/dL (ref 0.0–40.0)

## 2012-07-31 MED ORDER — ALPRAZOLAM 0.5 MG PO TABS: ORAL_TABLET | ORAL | Status: DC

## 2012-07-31 NOTE — Assessment & Plan Note (Signed)
Cont meds Check labs 

## 2012-07-31 NOTE — Assessment & Plan Note (Signed)
Stable con't meds 

## 2012-07-31 NOTE — Progress Notes (Signed)
  Subjective:    Beth Collier is a 79 y.o. female here for follow up of dyslipidemia. The patient does not use medications that may worsen dyslipidemias (corticosteroids, progestins, anabolic steroids, diuretics, beta-blockers, amiodarone, cyclosporine, olanzapine). The patient exercises infrequently. The patient is not known to have coexisting coronary artery disease.   Cardiac Risk Factors Age > 45-female, > 55-female:  YES  +1  Smoking:   NO  Sig. family hx of CHD*:  NO  Hypertension:   YES  +1  Diabetes:   NO  HDL < 35:   NO  HDL > 59:   NO  Total: 2   *- Sig. family h/o CHD per NCEP = MI or sudden death at <55yo in  father or other 1st-degree female relative, or <65yo in mother or  other 1st-degree female relative  The following portions of the patient's history were reviewed and updated as appropriate: allergies, current medications, past family history, past medical history, past social history, past surgical history and problem list.  Review of Systems Pertinent items are noted in HPI.    Objective:    BP 116/80  Pulse 78  Temp(Src) 98.3 F (36.8 C) (Oral)  Wt 107 lb 6.4 oz (48.716 kg)  BMI 21.34 kg/m2  SpO2 95% General appearance: alert, cooperative, appears stated age and no distress Neck: no adenopathy, no carotid bruit, supple, symmetrical, trachea midline and thyroid not enlarged, symmetric, no tenderness/mass/nodules Lungs: clear to auscultation bilaterally Heart: S1, S2 normal Extremities: extremities normal, atraumatic, no cyanosis or edema  Lab Review Lab Results  Component Value Date   CHOL 176 02/01/2012   CHOL 154 08/16/2011   CHOL 187 01/31/2011   HDL 44.90 02/01/2012   HDL 53.40 08/16/2011   HDL 45.40 01/31/2011   LDLDIRECT 210.7 01/26/2009      Assessment:    Dyslipidemia as detailed above with 2 CHD risk factors using NCEP scheme above.  Target levels for LDL are: < 100 mg/dl (CHD or "CHD risk equivalent" is present)  Explained to the  patient the respective contributions of genetics, diet, and exercise to lipid levels and the use of medication in severe cases which do not respond to lifestyle alteration. The patient's interest and motivation in making lifestyle changes seems good.    Plan:    The following changes are planned for the next 6 months, at which time the patient will return for repeat fasting lipids:  1. Dietary changes: Increase soluble fiber Plant sterols 2grams per day (e.g. Benecol): no Reduce saturated fat, "trans" monounsaturated fatty acids, and cholesterol 2. Exercise changes:  na 3. Other treatment: None 4. Lipid-lowering medications: cont  (Recommended by NCEP after 3-6 mos of dietary therapy & lifestyle modification,  except if CHD is present or LDL well above 190.) 5. Hormone replacement therapy (patient is a postmenopausal  woman): no 6. Screening for secondary causes of dyslipidemias: None indicated 7. Lipid screening for relatives: na 8. Follow up: 6 months.  Note: The majority of the visit was spent in counseling on the pathophysiology and treatment of dyslipidemias. The total face-to-face time was in excess of 25 minutes.

## 2012-07-31 NOTE — Patient Instructions (Addendum)

## 2012-07-31 NOTE — Progress Notes (Signed)
Pt unable to void at time of visit

## 2012-11-15 ENCOUNTER — Ambulatory Visit (INDEPENDENT_AMBULATORY_CARE_PROVIDER_SITE_OTHER): Payer: Medicare Other

## 2012-11-15 DIAGNOSIS — Z23 Encounter for immunization: Secondary | ICD-10-CM

## 2012-12-10 ENCOUNTER — Encounter: Payer: Self-pay | Admitting: Family Medicine

## 2012-12-10 ENCOUNTER — Ambulatory Visit (INDEPENDENT_AMBULATORY_CARE_PROVIDER_SITE_OTHER): Payer: Medicare Other | Admitting: Family Medicine

## 2012-12-10 VITALS — BP 114/72 | HR 67 | Temp 98.0°F | Wt 109.0 lb

## 2012-12-10 DIAGNOSIS — R300 Dysuria: Secondary | ICD-10-CM

## 2012-12-10 DIAGNOSIS — J019 Acute sinusitis, unspecified: Secondary | ICD-10-CM

## 2012-12-10 LAB — POCT URINALYSIS DIPSTICK
Leukocytes, UA: NEGATIVE
Protein, UA: NEGATIVE
Spec Grav, UA: <=1.005
Urobilinogen, UA: NEGATIVE
pH, UA: 7.5

## 2012-12-10 MED ORDER — CLARITHROMYCIN ER 500 MG PO TB24: 1000.0000 mg | ORAL_TABLET | Freq: Every day | ORAL | Status: AC

## 2012-12-10 NOTE — Patient Instructions (Signed)

## 2012-12-10 NOTE — Progress Notes (Signed)
  Subjective:     Beth Collier is a 79 y.o. female who presents for evaluation of sinus pain. Symptoms include: congestion, facial pain, nasal congestion, post nasal drip and sinus pressure. Onset of symptoms was 2 weeks ago. Symptoms have been gradually worsening since that time. Past history is significant for no history of pneumonia or bronchitis. Patient is a non-smoker.  The following portions of the patient's history were reviewed and updated as appropriate: allergies, current medications, past family history, past medical history, past social history, past surgical history and problem list.  Review of Systems Pertinent items are noted in HPI.   Objective:    BP 114/72  Pulse 67  Temp(Src) 98 F (36.7 C) (Oral)  Wt 109 lb (49.442 kg)  BMI 21.66 kg/m2 General appearance: alert, cooperative, appears stated age and no distress Ears: normal TM's and external ear canals both ears Nose: green discharge, moderate congestion, turbinates red, swollen, sinus tenderness bilateral Throat: lips, mucosa, and tongue normal; teeth and gums normal Neck: moderate anterior cervical adenopathy, supple, symmetrical, trachea midline and thyroid not enlarged, symmetric, no tenderness/mass/nodules Lungs: clear to auscultation bilaterally Heart: S1, S2 normal Lymph nodes: Cervical adenopathy: mild b/l cervical adenopathy    Assessment:    Acute bacterial sinusitis.    Plan:    Nasal steroids per medication orders. Antihistamines per medication orders. Ceftin per medication orders.

## 2012-12-17 ENCOUNTER — Other Ambulatory Visit: Payer: Self-pay

## 2012-12-17 MED ORDER — IMIPRAMINE HCL 25 MG PO TABS: 75.0000 mg | ORAL_TABLET | Freq: Every day | ORAL | Status: DC

## 2012-12-17 MED ORDER — ATENOLOL 25 MG PO TABS: 25.0000 mg | ORAL_TABLET | Freq: Every day | ORAL | Status: DC

## 2013-02-01 ENCOUNTER — Ambulatory Visit (INDEPENDENT_AMBULATORY_CARE_PROVIDER_SITE_OTHER): Payer: Medicare Other | Admitting: Family Medicine

## 2013-02-01 ENCOUNTER — Encounter: Payer: Self-pay | Admitting: Family Medicine

## 2013-02-01 VITALS — BP 116/80 | HR 82 | Temp 98.5°F | Ht 58.5 in | Wt 105.4 lb

## 2013-02-01 DIAGNOSIS — M810 Age-related osteoporosis without current pathological fracture: Secondary | ICD-10-CM

## 2013-02-01 DIAGNOSIS — Z0000 Encounter for general adult medical examination without abnormal findings: Secondary | ICD-10-CM

## 2013-02-01 DIAGNOSIS — I10 Essential (primary) hypertension: Secondary | ICD-10-CM

## 2013-02-01 DIAGNOSIS — E785 Hyperlipidemia, unspecified: Secondary | ICD-10-CM

## 2013-02-01 DIAGNOSIS — G4700 Insomnia, unspecified: Secondary | ICD-10-CM

## 2013-02-01 DIAGNOSIS — R5381 Other malaise: Secondary | ICD-10-CM

## 2013-02-01 LAB — CBC WITH DIFFERENTIAL/PLATELET
Basophils Absolute: 0 10*3/uL (ref 0.0–0.1)
Hemoglobin: 12.4 g/dL (ref 12.0–15.0)
Lymphocytes Relative: 17.9 % (ref 12.0–46.0)
Monocytes Absolute: 0.6 10*3/uL (ref 0.1–1.0)
Monocytes Relative: 10.4 % (ref 3.0–12.0)
Neutrophils Relative %: 68.8 % (ref 43.0–77.0)
Platelets: 339 10*3/uL (ref 150.0–400.0)
RBC: 3.93 Mil/uL (ref 3.87–5.11)
RDW: 13.4 % (ref 11.5–14.6)
WBC: 5.8 10*3/uL (ref 4.5–10.5)

## 2013-02-01 LAB — HEPATIC FUNCTION PANEL
AST: 24 U/L (ref 0–37)
Alkaline Phosphatase: 54 U/L (ref 39–117)
Bilirubin, Direct: 0 mg/dL (ref 0.0–0.3)
Total Bilirubin: 0.5 mg/dL (ref 0.3–1.2)
Total Protein: 7.8 g/dL (ref 6.0–8.3)

## 2013-02-01 LAB — LIPID PANEL
Cholesterol: 185 mg/dL (ref 0–200)
LDL Cholesterol: 99 mg/dL (ref 0–99)
Total CHOL/HDL Ratio: 3
VLDL: 21.6 mg/dL (ref 0.0–40.0)

## 2013-02-01 LAB — BASIC METABOLIC PANEL
BUN: 14 mg/dL (ref 6–23)
Calcium: 9.3 mg/dL (ref 8.4–10.5)
Creatinine, Ser: 0.7 mg/dL (ref 0.4–1.2)
GFR: 93.02 mL/min (ref >60.00)
Glucose, Bld: 88 mg/dL (ref 70–99)
Sodium: 131 mEq/L — ABNORMAL LOW (ref 135–145)

## 2013-02-01 LAB — VITAMIN B12: Vitamin B-12: 1218 pg/mL — ABNORMAL HIGH (ref 211–911)

## 2013-02-01 MED ORDER — ZOLPIDEM TARTRATE 5 MG PO TABS: 5.0000 mg | ORAL_TABLET | Freq: Every evening | ORAL | Status: DC | PRN

## 2013-02-01 MED ORDER — ALPRAZOLAM 0.5 MG PO TABS: ORAL_TABLET | ORAL | Status: DC

## 2013-02-01 MED ORDER — DENOSUMAB 60 MG/ML ~~LOC~~ SOLN: 60.0000 mg | Freq: Once | SUBCUTANEOUS | Status: DC

## 2013-02-01 NOTE — Progress Notes (Signed)
Pre visit review using our clinic review tool, if applicable. No additional management support is needed unless otherwise documented below in the visit note. 

## 2013-02-01 NOTE — Assessment & Plan Note (Addendum)
Calcium and vita D prolia q53m bmd due

## 2013-02-01 NOTE — Progress Notes (Signed)
Subjective:    Beth Collier is a 79 y.o. female who presents for Medicare Annual/Subsequent preventive examination.  Preventive Screening-Counseling & Management  Tobacco History  Smoking status  . Never Smoker   Smokeless tobacco  . Not on file     Problems Prior to Visit 1. No energy,  Blue feeling  Current Problems (verified) Patient Active Problem List   Diagnosis Date Noted  . MALAISE AND FATIGUE 01/26/2009  . URI 07/14/2008  . HYPERTENSION 06/14/2007  . LOC OSTEOARTHROS NOT SPEC PRIM/SEC UNSPEC SITE 06/14/2007  . HYPERLIPIDEMIA 09/25/2006  . LOSS, HEARING NOS 09/25/2006  . OTITIS MEDIA, SEROUS, LEFT 08/17/2006  . ANXIETY 06/23/2006  . DEPRESSION 06/23/2006  . OSTEOPOROSIS 06/23/2006  . TACHYCARDIA, HX OF 06/23/2006    Medications Prior to Visit Current Outpatient Prescriptions on File Prior to Visit  Medication Sig Dispense Refill  . aspirin 81 MG EC tablet Take 81 mg by mouth at bedtime.       Marland Kitchen atenolol (TENORMIN) 25 MG tablet Take 1 tablet (25 mg total) by mouth daily.  90 tablet  1  . atorvastatin (LIPITOR) 40 MG tablet Take 40 mg by mouth daily.      Marland Kitchen CALCIUM PO Take by mouth. 1800 mg daily       . Cholecalciferol (VITAMIN D3) 1000 UNITS CAPS Take 3,000 Units by mouth daily.       Marland Kitchen imipramine (TOFRANIL) 25 MG tablet Take 3 tablets (75 mg total) by mouth at bedtime.  270 tablet  1  . loratadine (CLARITIN) 10 MG tablet Take 10 mg by mouth daily.        Marland Kitchen omeprazole (PRILOSEC OTC) 20 MG tablet Take 20 mg by mouth daily.        . pentazocine-acetaminophen (TALACEN) 25-650 MG TABS Take 1 tablet by mouth 3 (three) times daily.       No current facility-administered medications on file prior to visit.    Current Medications (verified) Current Outpatient Prescriptions  Medication Sig Dispense Refill  . ALPRAZolam (XANAX) 0.5 MG tablet TAKE ONE TABLET BY MOUTH ONCE DAILY AT BEDTIME AS NEEDED FOR SLEEP  90 tablet  1  . aspirin 81 MG EC tablet Take 81 mg by  mouth at bedtime.       Marland Kitchen atenolol (TENORMIN) 25 MG tablet Take 1 tablet (25 mg total) by mouth daily.  90 tablet  1  . atorvastatin (LIPITOR) 40 MG tablet Take 40 mg by mouth daily.      Marland Kitchen CALCIUM PO Take by mouth. 1800 mg daily       . Cholecalciferol (VITAMIN D3) 1000 UNITS CAPS Take 3,000 Units by mouth daily.       Marland Kitchen imipramine (TOFRANIL) 25 MG tablet Take 3 tablets (75 mg total) by mouth at bedtime.  270 tablet  1  . loratadine (CLARITIN) 10 MG tablet Take 10 mg by mouth daily.        Marland Kitchen omeprazole (PRILOSEC OTC) 20 MG tablet Take 20 mg by mouth daily.        . pentazocine-acetaminophen (TALACEN) 25-650 MG TABS Take 1 tablet by mouth 3 (three) times daily.      Marland Kitchen zolpidem (AMBIEN) 5 MG tablet Take 1 tablet (5 mg total) by mouth at bedtime as needed for sleep.  30 tablet  1   No current facility-administered medications for this visit.     Allergies (verified) Cephalexin; Clindamycin; Codeine; Demerol; Hydrocodone-acetaminophen; and Penicillins   PAST HISTORY  Family History Family  History  Problem Relation Age of Onset  . Breast cancer      <50  . Ovarian cancer    . Coronary artery disease      <60  . Depression    . Arthritis    . Alcohol abuse    . Osteoporosis Mother   . Alzheimer's disease Mother   . Mental illness Mother     alzheimers  . Celiac disease Father   . Cancer Other     breast, ovarian    Social History History  Substance Use Topics  . Smoking status: Never Smoker   . Smokeless tobacco: Not on file  . Alcohol Use: No     Are there smokers in your home (other than you)? No  Risk Factors Current exercise habits: not lately because no energy  Dietary issues discussed: na   Cardiac risk factors: advanced age (older than 67 for men, 78 for women), dyslipidemia, hypertension and sedentary lifestyle.  Depression Screen (Note: if answer to either of the following is "Yes", a more complete depression screening is indicated)   Over the past two  weeks, have you felt down, depressed or hopeless? Yes  Over the past two weeks, have you felt little interest or pleasure in doing things? Yes  Have you lost interest or pleasure in daily life? No  Do you often feel hopeless? No  Do you cry easily over simple problems? No  Activities of Daily Living In your present state of health, do you have any difficulty performing the following activities?:  Driving? No Managing money?  No Feeding yourself? No Getting from bed to chair? No Climbing a flight of stairs? No Preparing food and eating?: No Bathing or showering? No Getting dressed: No Getting to the toilet? No Using the toilet:No Moving around from place to place: No In the past year have you fallen or had a near fall?:No   Are you sexually active?  No  Do you have more than one partner?  No  Hearing Difficulties: Yes Do you often ask people to speak up or repeat themselves? Yes Do you experience ringing or noises in your ears? No Do you have difficulty understanding soft or whispered voices? Yes   Do you feel that you have a problem with memory? No  Do you often misplace items? No  Do you feel safe at home?  Yes  Cognitive Testing  Alert? Yes  Normal Appearance?Yes  Oriented to person? Yes  Place? Yes   Time? Yes  Recall of three objects?  Yes  Can perform simple calculations? Yes  Displays appropriate judgment?Yes  Can read the correct time from a watch face?Yes   Advanced Directives have been discussed with the patient? Yes  List the Names of Other Physician/Practitioners you currently use: 1.  opth-[facility] 2 dentist- [place] 3 ortho--[facility]   Indicate any recent Medical Services you may have received from other than [facility] providers in the past year (date may be approximate).  Immunization History  Administered Date(s) Administered  . Influenza Split 12/02/2010, 12/13/2011  . Influenza Whole 01/03/2007, 12/11/2007, 12/03/2008, 12/08/2009  . Influenza,inj,Quad  PF,36+ Mos 11/15/2012  . Pneumococcal Polysaccharide-23 07/19/2005  . Zoster 02/19/2010    Screening Tests Health Maintenance  Topic Date Due  . Tetanus/tdap  03/28/1949  . Mammogram  01/17/2012  . Influenza Vaccine  09/21/2013  . Colonoscopy  01/27/2019  . Pneumococcal Polysaccharide Vaccine Age 59 And Over  Completed  . Zostavax  Completed    All  answers were reviewed with the patient and necessary referrals were made:  Loreen Freud, DO   02/01/2013   History reviewed:  She  has a past medical history of Anxiety; Depression; Osteoporosis; Hyperlipidemia; and Hypertension. She  does not have any pertinent problems on file. She  has past surgical history that includes Lumbar laminectomy. Her family history includes Alcohol abuse in an other family member; Alzheimer's disease in her mother; Arthritis in an other family member; Breast cancer in an other family member; Cancer in her other; Celiac disease in her father; Coronary artery disease in an other family member; Depression in an other family member; Mental illness in her mother; Osteoporosis in her mother; Ovarian cancer in an other family member. She  reports that she has never smoked. She does not have any smokeless tobacco history on file. She reports that she does not drink alcohol or use illicit drugs. She has a current medication list which includes the following prescription(s): alprazolam, aspirin, atenolol, atorvastatin, calcium, vitamin d3, imipramine, loratadine, omeprazole, pentazocine-acetaminophen, and zolpidem. Current Outpatient Prescriptions on File Prior to Visit  Medication Sig Dispense Refill  . aspirin 81 MG EC tablet Take 81 mg by mouth at bedtime.       Marland Kitchen atenolol (TENORMIN) 25 MG tablet Take 1 tablet (25 mg total) by mouth daily.  90 tablet  1  . atorvastatin (LIPITOR) 40 MG tablet Take 40 mg by mouth daily.      Marland Kitchen CALCIUM PO Take by mouth. 1800 mg daily       . Cholecalciferol (VITAMIN D3) 1000 UNITS CAPS  Take 3,000 Units by mouth daily.       Marland Kitchen imipramine (TOFRANIL) 25 MG tablet Take 3 tablets (75 mg total) by mouth at bedtime.  270 tablet  1  . loratadine (CLARITIN) 10 MG tablet Take 10 mg by mouth daily.        Marland Kitchen omeprazole (PRILOSEC OTC) 20 MG tablet Take 20 mg by mouth daily.        . pentazocine-acetaminophen (TALACEN) 25-650 MG TABS Take 1 tablet by mouth 3 (three) times daily.       No current facility-administered medications on file prior to visit.   She is allergic to cephalexin; clindamycin; codeine; demerol; hydrocodone-acetaminophen; and penicillins.  Review of Systems  Review of Systems  Constitutional: Negative for activity change, appetite change and fatigue.  HENT: Negative for hearing loss, congestion, tinnitus and ear discharge.   Eyes: Negative for visual disturbance (see optho q1y -- vision corrected to 20/20 with glasses).  Respiratory: Negative for cough, chest tightness and shortness of breath.   Cardiovascular: Negative for chest pain, palpitations and leg swelling.  Gastrointestinal: Negative for abdominal pain, diarrhea, constipation and abdominal distention.  Genitourinary: Negative for urgency, frequency, decreased urine volume and difficulty urinating.  Musculoskeletal: Negative for back pain, arthralgias and gait problem.  Skin: Negative for color change, pallor and rash.  Neurological: Negative for dizziness, light-headedness, numbness and headaches.  Hematological: Negative for adenopathy. Does not bruise/bleed easily.  Psychiatric/Behavioral: Negative for suicidal ideas, confusion, sleep disturbance, self-injury, dysphoric mood, decreased concentration and agitation.  Pt is able to read and write and can do all ADLs No risk for falling No abuse/ violence in home      Objective:     Vision by Snellen chart: opth  Body mass index is 21.65 kg/(m^2). BP 116/80  Pulse 82  Temp(Src) 98.5 F (36.9 C) (Oral)  Ht 4' 10.5" (1.486 m)  Wt 105 lb 6.4  oz  (47.809 kg)  BMI 21.65 kg/m2  SpO2 99%  BP 116/80  Pulse 82  Temp(Src) 98.5 F (36.9 C) (Oral)  Ht 4' 10.5" (1.486 m)  Wt 105 lb 6.4 oz (47.809 kg)  BMI 21.65 kg/m2  SpO2 99% General appearance: alert, cooperative, appears stated age and no distress Head: Normocephalic, without obvious abnormality, atraumatic Eyes: conjunctivae/corneas clear. PERRL, EOM&#39;s intact. Fundi benign. Ears: normal TM&#39;s and external ear canals both ears Nose: Nares normal. Septum midline. Mucosa normal. No drainage or sinus tenderness. Throat: lips, mucosa, and tongue normal; teeth and gums normal Neck: no adenopathy, no carotid bruit, no JVD, supple, symmetrical, trachea midline and thyroid not enlarged, symmetric, no tenderness/mass/nodules Back: symmetric, no curvature. ROM normal. No CVA tenderness. Lungs: clear to auscultation bilaterally Breasts: normal appearance, no masses or tenderness Heart: regular rate and rhythm, S1, S2 normal, no murmur, click, rub or gallop Abdomen: soft, non-tender; bowel sounds normal; no masses,  no organomegaly Pelvic: not indicated; post-menopausal, no abnormal Pap smears in past Extremities: extremities normal, atraumatic, no cyanosis or edema Pulses: 2+ and symmetric Skin: Skin color, texture, turgor normal. No rashes or lesions Lymph nodes: Cervical, supraclavicular, and axillary nodes normal. Neurologic: Alert and oriented X 3, normal strength and tone. Normal symmetric reflexes. Normal coordination and gait Psych- no depression, no anxiety      Assessment:     cpe      Plan:    check labs con't meds During the course of the visit the patient was educated and counseled about appropriate screening and preventive services including:    Pneumococcal vaccine   Influenza vaccine  Screening mammography  Bone densitometry screening  Colorectal cancer screening  Advanced directives: has an advanced directive - a copy HAS NOT been  provided.  Diet review for nutrition referral? Yes ____  Not Indicated ___x_   Patient Instructions (the written plan) was given to the patient.  Medicare Attestation I have personally reviewed: The patient's medical and social history Their use of alcohol, tobacco or illicit drugs Their current medications and supplements The patient's functional ability including ADLs,fall risks, home safety risks, cognitive, and hearing and visual impairment Diet and physical activities Evidence for depression or mood disorders  The patient's weight, height, BMI, and visual acuity have been recorded in the chart.  I have made referrals, counseling, and provided education to the patient based on review of the above and I have provided the patient with a written personalized care plan for preventive services.     Loreen Freud, DO   02/01/2013

## 2013-02-01 NOTE — Assessment & Plan Note (Signed)
Check labs con't meds 

## 2013-02-01 NOTE — Patient Instructions (Signed)
Preventive Care for Adults, Female A healthy lifestyle and preventive care can promote health and wellness. Preventive health guidelines for women include the following key practices.  A routine yearly physical is a good way to check with your caregiver about your health and preventive screening. It is a chance to share any concerns and updates on your health, and to receive a thorough exam.  Visit your dentist for a routine exam and preventive care every 6 months. Brush your teeth twice a day and floss once a day. Good oral hygiene prevents tooth decay and gum disease.  The frequency of eye exams is based on your age, health, family medical history, use of contact lenses, and other factors. Follow your caregiver's recommendations for frequency of eye exams.  Eat a healthy diet. Foods like vegetables, fruits, whole grains, low-fat dairy products, and lean protein foods contain the nutrients you need without too many calories. Decrease your intake of foods high in solid fats, added sugars, and salt. Eat the right amount of calories for you.Get information about a proper diet from your caregiver, if necessary.  Regular physical exercise is one of the most important things you can do for your health. Most adults should get at least 150 minutes of moderate-intensity exercise (any activity that increases your heart rate and causes you to sweat) each week. In addition, most adults need muscle-strengthening exercises on 2 or more days a week.  Maintain a healthy weight. The body mass index (BMI) is a screening tool to identify possible weight problems. It provides an estimate of body fat based on height and weight. Your caregiver can help determine your BMI, and can help you achieve or maintain a healthy weight.For adults 20 years and older:  A BMI below 18.5 is considered underweight.  A BMI of 18.5 to 24.9 is normal.  A BMI of 25 to 29.9 is considered overweight.  A BMI of 30 and above is  considered obese.  Maintain normal blood lipids and cholesterol levels by exercising and minimizing your intake of saturated fat. Eat a balanced diet with plenty of fruit and vegetables. Blood tests for lipids and cholesterol should begin at age 20 and be repeated every 5 years. If your lipid or cholesterol levels are high, you are over 50, or you are at high risk for heart disease, you may need your cholesterol levels checked more frequently.Ongoing high lipid and cholesterol levels should be treated with medicines if diet and exercise are not effective.  If you smoke, find out from your caregiver how to quit. If you do not use tobacco, do not start.  Lung cancer screening is recommended for adults aged 55 80 years who are at high risk for developing lung cancer because of a history of smoking. Yearly low-dose computed tomography (CT) is recommended for people who have at least a 30-pack-year history of smoking and are a current smoker or have quit within the past 15 years. A pack year of smoking is smoking an average of 1 pack of cigarettes a day for 1 year (for example: 1 pack a day for 30 years or 2 packs a day for 15 years). Yearly screening should continue until the smoker has stopped smoking for at least 15 years. Yearly screening should also be stopped for people who develop a health problem that would prevent them from having lung cancer treatment.  If you are pregnant, do not drink alcohol. If you are breastfeeding, be very cautious about drinking alcohol. If you are   not pregnant and choose to drink alcohol, do not exceed 1 drink per day. One drink is considered to be 12 ounces (355 mL) of beer, 5 ounces (148 mL) of wine, or 1.5 ounces (44 mL) of liquor.  Avoid use of street drugs. Do not share needles with anyone. Ask for help if you need support or instructions about stopping the use of drugs.  High blood pressure causes heart disease and increases the risk of stroke. Your blood pressure  should be checked at least every 1 to 2 years. Ongoing high blood pressure should be treated with medicines if weight loss and exercise are not effective.  If you are 55 to 77 years old, ask your caregiver if you should take aspirin to prevent strokes.  Diabetes screening involves taking a blood sample to check your fasting blood sugar level. This should be done once every 3 years, after age 45, if you are within normal weight and without risk factors for diabetes. Testing should be considered at a younger age or be carried out more frequently if you are overweight and have at least 1 risk factor for diabetes.  Breast cancer screening is essential preventive care for women. You should practice "breast self-awareness." This means understanding the normal appearance and feel of your breasts and may include breast self-examination. Any changes detected, no matter how small, should be reported to a caregiver. Women in their 20s and 30s should have a clinical breast exam (CBE) by a caregiver as part of a regular health exam every 1 to 3 years. After age 40, women should have a CBE every year. Starting at age 40, women should consider having a mammography (breast X-ray test) every year. Women who have a family history of breast cancer should talk to their caregiver about genetic screening. Women at a high risk of breast cancer should talk to their caregivers about having magnetic resonance imaging (MRI) and a mammography every year.  Breast cancer gene (BRCA)-related cancer risk assessment is recommended for women who have family members with BRCA-related cancers. BRCA-related cancers include breast, ovarian, tubal, and peritoneal cancers. Having family members with these cancers may be associated with an increased risk for harmful changes (mutations) in the breast cancer genes BRCA1 and BRCA2. Results of the assessment will determine the need for genetic counseling and BRCA1 and BRCA2 testing.  The Pap test is  a screening test for cervical cancer. A Pap test can show cell changes on the cervix that might become cervical cancer if left untreated. A Pap test is a procedure in which cells are obtained and examined from the lower end of the uterus (cervix).  Women should have a Pap test starting at age 21.  Between ages 21 and 29, Pap tests should be repeated every 2 years.  Beginning at age 30, you should have a Pap test every 3 years as long as the past 3 Pap tests have been normal.  Some women have medical problems that increase the chance of getting cervical cancer. Talk to your caregiver about these problems. It is especially important to talk to your caregiver if a new problem develops soon after your last Pap test. In these cases, your caregiver may recommend more frequent screening and Pap tests.  The above recommendations are the same for women who have or have not gotten the vaccine for human papillomavirus (HPV).  If you had a hysterectomy for a problem that was not cancer or a condition that could lead to cancer, then   you no longer need Pap tests. Even if you no longer need a Pap test, a regular exam is a good idea to make sure no other problems are starting.  If you are between ages 65 and 70, and you have had normal Pap tests going back 10 years, you no longer need Pap tests. Even if you no longer need a Pap test, a regular exam is a good idea to make sure no other problems are starting.  If you have had past treatment for cervical cancer or a condition that could lead to cancer, you need Pap tests and screening for cancer for at least 20 years after your treatment.  If Pap tests have been discontinued, risk factors (such as a new sexual partner) need to be reassessed to determine if screening should be resumed.  The HPV test is an additional test that may be used for cervical cancer screening. The HPV test looks for the virus that can cause the cell changes on the cervix. The cells collected  during the Pap test can be tested for HPV. The HPV test could be used to screen women aged 30 years and older, and should be used in women of any age who have unclear Pap test results. After the age of 30, women should have HPV testing at the same frequency as a Pap test.  Colorectal cancer can be detected and often prevented. Most routine colorectal cancer screening begins at the age of 50 and continues through age 75. However, your caregiver may recommend screening at an earlier age if you have risk factors for colon cancer. On a yearly basis, your caregiver may provide home test kits to check for hidden blood in the stool. Use of a small camera at the end of a tube, to directly examine the colon (sigmoidoscopy or colonoscopy), can detect the earliest forms of colorectal cancer. Talk to your caregiver about this at age 50, when routine screening begins. Direct examination of the colon should be repeated every 5 to 10 years through age 75, unless early forms of pre-cancerous polyps or small growths are found.  Hepatitis C blood testing is recommended for all people born from 1945 through 1965 and any individual with known risks for hepatitis C.  Practice safe sex. Use condoms and avoid high-risk sexual practices to reduce the spread of sexually transmitted infections (STIs). STIs include gonorrhea, chlamydia, syphilis, trichomonas, herpes, HPV, and human immunodeficiency virus (HIV). Herpes, HIV, and HPV are viral illnesses that have no cure. They can result in disability, cancer, and death. Sexually active women aged 25 and younger should be checked for chlamydia. Older women with new or multiple partners should also be tested for chlamydia. Testing for other STIs is recommended if you are sexually active and at increased risk.  Osteoporosis is a disease in which the bones lose minerals and strength with aging. This can result in serious bone fractures. The risk of osteoporosis can be identified using a  bone density scan. Women ages 65 and over and women at risk for fractures or osteoporosis should discuss screening with their caregivers. Ask your caregiver whether you should take a calcium supplement or vitamin D to reduce the rate of osteoporosis.  Menopause can be associated with physical symptoms and risks. Hormone replacement therapy is available to decrease symptoms and risks. You should talk to your caregiver about whether hormone replacement therapy is right for you.  Use sunscreen. Apply sunscreen liberally and repeatedly throughout the day. You should seek shade   when your shadow is shorter than you. Protect yourself by wearing long sleeves, pants, a wide-brimmed hat, and sunglasses year round, whenever you are outdoors.  Once a month, do a whole body skin exam, using a mirror to look at the skin on your back. Notify your caregiver of new moles, moles that have irregular borders, moles that are larger than a pencil eraser, or moles that have changed in shape or color.  Stay current with required immunizations.  Influenza vaccine. All adults should be immunized every year.  Tetanus, diphtheria, and acellular pertussis (Td, Tdap) vaccine. Pregnant women should receive 1 dose of Tdap vaccine during each pregnancy. The dose should be obtained regardless of the length of time since the last dose. Immunization is preferred during the 27th to 36th week of gestation. An adult who has not previously received Tdap or who does not know her vaccine status should receive 1 dose of Tdap. This initial dose should be followed by tetanus and diphtheria toxoids (Td) booster doses every 10 years. Adults with an unknown or incomplete history of completing a 3-dose immunization series with Td-containing vaccines should begin or complete a primary immunization series including a Tdap dose. Adults should receive a Td booster every 10 years.  Varicella vaccine. An adult without evidence of immunity to varicella  should receive 2 doses or a second dose if she has previously received 1 dose. Pregnant females who do not have evidence of immunity should receive the first dose after pregnancy. This first dose should be obtained before leaving the health care facility. The second dose should be obtained 4 8 weeks after the first dose.  Human papillomavirus (HPV) vaccine. Females aged 13 26 years who have not received the vaccine previously should obtain the 3-dose series. The vaccine is not recommended for use in pregnant females. However, pregnancy testing is not needed before receiving a dose. If a female is found to be pregnant after receiving a dose, no treatment is needed. In that case, the remaining doses should be delayed until after the pregnancy. Immunization is recommended for any person with an immunocompromised condition through the age of 26 years if she did not get any or all doses earlier. During the 3-dose series, the second dose should be obtained 4 8 weeks after the first dose. The third dose should be obtained 24 weeks after the first dose and 16 weeks after the second dose.  Zoster vaccine. One dose is recommended for adults aged 60 years or older unless certain conditions are present.  Measles, mumps, and rubella (MMR) vaccine. Adults born before 1957 generally are considered immune to measles and mumps. Adults born in 1957 or later should have 1 or more doses of MMR vaccine unless there is a contraindication to the vaccine or there is laboratory evidence of immunity to each of the three diseases. A routine second dose of MMR vaccine should be obtained at least 28 days after the first dose for students attending postsecondary schools, health care workers, or international travelers. People who received inactivated measles vaccine or an unknown type of measles vaccine during 1963 1967 should receive 2 doses of MMR vaccine. People who received inactivated mumps vaccine or an unknown type of mumps vaccine  before 1979 and are at high risk for mumps infection should consider immunization with 2 doses of MMR vaccine. For females of childbearing age, rubella immunity should be determined. If there is no evidence of immunity, females who are not pregnant should be vaccinated. If there   is no evidence of immunity, females who are pregnant should delay immunization until after pregnancy. Unvaccinated health care workers born before 1957 who lack laboratory evidence of measles, mumps, or rubella immunity or laboratory confirmation of disease should consider measles and mumps immunization with 2 doses of MMR vaccine or rubella immunization with 1 dose of MMR vaccine.  Pneumococcal 13-valent conjugate (PCV13) vaccine. When indicated, a person who is uncertain of her immunization history and has no record of immunization should receive the PCV13 vaccine. An adult aged 19 years or older who has certain medical conditions and has not been previously immunized should receive 1 dose of PCV13 vaccine. This PCV13 should be followed with a dose of pneumococcal polysaccharide (PPSV23) vaccine. The PPSV23 vaccine dose should be obtained at least 8 weeks after the dose of PCV13 vaccine. An adult aged 19 years or older who has certain medical conditions and previously received 1 or more doses of PPSV23 vaccine should receive 1 dose of PCV13. The PCV13 vaccine dose should be obtained 1 or more years after the last PPSV23 vaccine dose.  Pneumococcal polysaccharide (PPSV23) vaccine. When PCV13 is also indicated, PCV13 should be obtained first. All adults aged 65 years and older should be immunized. An adult younger than age 65 years who has certain medical conditions should be immunized. Any person who resides in a nursing home or long-term care facility should be immunized. An adult smoker should be immunized. People with an immunocompromised condition and certain other conditions should receive both PCV13 and PPSV23 vaccines. People  with human immunodeficiency virus (HIV) infection should be immunized as soon as possible after diagnosis. Immunization during chemotherapy or radiation therapy should be avoided. Routine use of PPSV23 vaccine is not recommended for American Indians, [place] Natives, or people younger than 65 years unless there are medical conditions that require PPSV23 vaccine. When indicated, people who have unknown immunization and have no record of immunization should receive PPSV23 vaccine. One-time revaccination 5 years after the first dose of PPSV23 is recommended for people aged 19 64 years who have chronic kidney failure, nephrotic syndrome, asplenia, or immunocompromised conditions. People who received 1 2 doses of PPSV23 before age 65 years should receive another dose of PPSV23 vaccine at age 65 years or later if at least 5 years have passed since the previous dose. Doses of PPSV23 are not needed for people immunized with PPSV23 at or after age 65 years.  Meningococcal vaccine. Adults with asplenia or persistent complement component deficiencies should receive 2 doses of quadrivalent meningococcal conjugate (MenACWY-D) vaccine. The doses should be obtained at least 2 months apart. Microbiologists working with certain meningococcal bacteria, military recruits, people at risk during an outbreak, and people who travel to or live in countries with a high rate of meningitis should be immunized. A first-year college student up through age 21 years who is living in a residence hall should receive a dose if she did not receive a dose on or after her 16th birthday. Adults who have certain high-risk conditions should receive one or more doses of vaccine.  Hepatitis A vaccine. Adults who wish to be protected from this disease, have certain high-risk conditions, work with hepatitis A-infected animals, work in hepatitis A research labs, or travel to or work in countries with a high rate of hepatitis A should be immunized. Adults  who were previously unvaccinated and who anticipate close contact with an international adoptee during the first 60 days after arrival in the [place] from a country   with a high rate of hepatitis A should be immunized.  Hepatitis B vaccine. Adults who wish to be protected from this disease, have certain high-risk conditions, may be exposed to blood or other infectious body fluids, are household contacts or sex partners of hepatitis B positive people, are clients or workers in certain care facilities, or travel to or work in countries with a high rate of hepatitis B should be immunized.  Haemophilus influenzae type b (Hib) vaccine. A previously unvaccinated person with asplenia or sickle cell disease or having a scheduled splenectomy should receive 1 dose of Hib vaccine. Regardless of previous immunization, a recipient of a hematopoietic stem cell transplant should receive a 3-dose series 6 12 months after her successful transplant. Hib vaccine is not recommended for adults with HIV infection. Preventive Services / Frequency Ages 19 to 39  Blood pressure check.** / Every 1 to 2 years.  Lipid and cholesterol check.** / Every 5 years beginning at age 20.  Clinical breast exam.** / Every 3 years for women in their 20s and 30s.  BRCA-related cancer risk assessment.** / For women who have family members with a BRCA-related cancer (breast, ovarian, tubal, or peritoneal cancers).  Pap test.** / Every 2 years from ages 21 through 29. Every 3 years starting at age 30 through age 65 or 70 with a history of 3 consecutive normal Pap tests.  HPV screening.** / Every 3 years from ages 30 through ages 65 to 70 with a history of 3 consecutive normal Pap tests.  Hepatitis C blood test.** / For any individual with known risks for hepatitis C.  Skin self-exam. / Monthly.  Influenza vaccine. / Every year.  Tetanus, diphtheria, and acellular pertussis (Tdap, Td) vaccine.** / Consult your caregiver. Pregnant  women should receive 1 dose of Tdap vaccine during each pregnancy. 1 dose of Td every 10 years.  Varicella vaccine.** / Consult your caregiver. Pregnant females who do not have evidence of immunity should receive the first dose after pregnancy.  HPV vaccine. / 3 doses over 6 months, if 26 and younger. The vaccine is not recommended for use in pregnant females. However, pregnancy testing is not needed before receiving a dose.  Measles, mumps, rubella (MMR) vaccine.** / You need at least 1 dose of MMR if you were born in 1957 or later. You may also need a 2nd dose. For females of childbearing age, rubella immunity should be determined. If there is no evidence of immunity, females who are not pregnant should be vaccinated. If there is no evidence of immunity, females who are pregnant should delay immunization until after pregnancy.  Pneumococcal 13-valent conjugate (PCV13) vaccine.** / Consult your caregiver.  Pneumococcal polysaccharide (PPSV23) vaccine.** / 1 to 2 doses if you smoke cigarettes or if you have certain conditions.  Meningococcal vaccine.** / 1 dose if you are age 19 to 21 years and a first-year college student living in a residence hall, or have one of several medical conditions, you need to get vaccinated against meningococcal disease. You may also need additional booster doses.  Hepatitis A vaccine.** / Consult your caregiver.  Hepatitis B vaccine.** / Consult your caregiver.  Haemophilus influenzae type b (Hib) vaccine.** / Consult your caregiver. Ages 40 to 64  Blood pressure check.** / Every 1 to 2 years.  Lipid and cholesterol check.** / Every 5 years beginning at age 20.  Lung cancer screening. / Every year if you are aged 55 80 years and have a 30-pack-year history of smoking and   currently smoke or have quit within the past 15 years. Yearly screening is stopped once you have quit smoking for at least 15 years or develop a health problem that would prevent you from having  lung cancer treatment.  Clinical breast exam.** / Every year after age 40.  BRCA-related cancer risk assessment.** / For women who have family members with a BRCA-related cancer (breast, ovarian, tubal, or peritoneal cancers).  Mammogram.** / Every year beginning at age 40 and continuing for as long as you are in good health. Consult with your caregiver.  Pap test.** / Every 3 years starting at age 30 through age 65 or 70 with a history of 3 consecutive normal Pap tests.  HPV screening.** / Every 3 years from ages 30 through ages 65 to 70 with a history of 3 consecutive normal Pap tests.  Fecal occult blood test (FOBT) of stool. / Every year beginning at age 50 and continuing until age 75. You may not need to do this test if you get a colonoscopy every 10 years.  Flexible sigmoidoscopy or colonoscopy.** / Every 5 years for a flexible sigmoidoscopy or every 10 years for a colonoscopy beginning at age 50 and continuing until age 75.  Hepatitis C blood test.** / For all people born from 1945 through 1965 and any individual with known risks for hepatitis C.  Skin self-exam. / Monthly.  Influenza vaccine. / Every year.  Tetanus, diphtheria, and acellular pertussis (Tdap/Td) vaccine.** / Consult your caregiver. Pregnant women should receive 1 dose of Tdap vaccine during each pregnancy. 1 dose of Td every 10 years.  Varicella vaccine.** / Consult your caregiver. Pregnant females who do not have evidence of immunity should receive the first dose after pregnancy.  Zoster vaccine.** / 1 dose for adults aged 60 years or older.  Measles, mumps, rubella (MMR) vaccine.** / You need at least 1 dose of MMR if you were born in 1957 or later. You may also need a 2nd dose. For females of childbearing age, rubella immunity should be determined. If there is no evidence of immunity, females who are not pregnant should be vaccinated. If there is no evidence of immunity, females who are pregnant should delay  immunization until after pregnancy.  Pneumococcal 13-valent conjugate (PCV13) vaccine.** / Consult your caregiver.  Pneumococcal polysaccharide (PPSV23) vaccine.** / 1 to 2 doses if you smoke cigarettes or if you have certain conditions.  Meningococcal vaccine.** / Consult your caregiver.  Hepatitis A vaccine.** / Consult your caregiver.  Hepatitis B vaccine.** / Consult your caregiver.  Haemophilus influenzae type b (Hib) vaccine.** / Consult your caregiver. Ages 65 and over  Blood pressure check.** / Every 1 to 2 years.  Lipid and cholesterol check.** / Every 5 years beginning at age 20.  Lung cancer screening. / Every year if you are aged 55 80 years and have a 30-pack-year history of smoking and currently smoke or have quit within the past 15 years. Yearly screening is stopped once you have quit smoking for at least 15 years or develop a health problem that would prevent you from having lung cancer treatment.  Clinical breast exam.** / Every year after age 40.  BRCA-related cancer risk assessment.** / For women who have family members with a BRCA-related cancer (breast, ovarian, tubal, or peritoneal cancers).  Mammogram.** / Every year beginning at age 40 and continuing for as long as you are in good health. Consult with your caregiver.  Pap test.** / Every 3 years starting at age   30 through age 65 or 70 with a 3 consecutive normal Pap tests. Testing can be stopped between 65 and 70 with 3 consecutive normal Pap tests and no abnormal Pap or HPV tests in the past 10 years.  HPV screening.** / Every 3 years from ages 30 through ages 65 or 70 with a history of 3 consecutive normal Pap tests. Testing can be stopped between 65 and 70 with 3 consecutive normal Pap tests and no abnormal Pap or HPV tests in the past 10 years.  Fecal occult blood test (FOBT) of stool. / Every year beginning at age 50 and continuing until age 75. You may not need to do this test if you get a colonoscopy  every 10 years.  Flexible sigmoidoscopy or colonoscopy.** / Every 5 years for a flexible sigmoidoscopy or every 10 years for a colonoscopy beginning at age 50 and continuing until age 75.  Hepatitis C blood test.** / For all people born from 1945 through 1965 and any individual with known risks for hepatitis C.  Osteoporosis screening.** / A one-time screening for women ages 65 and over and women at risk for fractures or osteoporosis.  Skin self-exam. / Monthly.  Influenza vaccine. / Every year.  Tetanus, diphtheria, and acellular pertussis (Tdap/Td) vaccine.** / 1 dose of Td every 10 years.  Varicella vaccine.** / Consult your caregiver.  Zoster vaccine.** / 1 dose for adults aged 60 years or older.  Pneumococcal 13-valent conjugate (PCV13) vaccine.** / Consult your caregiver.  Pneumococcal polysaccharide (PPSV23) vaccine.** / 1 dose for all adults aged 65 years and older.  Meningococcal vaccine.** / Consult your caregiver.  Hepatitis A vaccine.** / Consult your caregiver.  Hepatitis B vaccine.** / Consult your caregiver.  Haemophilus influenzae type b (Hib) vaccine.** / Consult your caregiver. ** Family history and personal history of risk and conditions may change your caregiver's recommendations. Document Released: 04/05/2001 Document Revised: 06/04/2012 Document Reviewed: 07/05/2010 Exit[employer] Patient Information 2014 [employer].  

## 2013-02-01 NOTE — Assessment & Plan Note (Signed)
Stable Cont meds 

## 2013-03-13 ENCOUNTER — Ambulatory Visit (INDEPENDENT_AMBULATORY_CARE_PROVIDER_SITE_OTHER): Payer: Medicare Other | Admitting: Internal Medicine

## 2013-03-13 ENCOUNTER — Encounter: Payer: Self-pay | Admitting: Internal Medicine

## 2013-03-13 ENCOUNTER — Ambulatory Visit (INDEPENDENT_AMBULATORY_CARE_PROVIDER_SITE_OTHER)
Admission: RE | Admit: 2013-03-13 | Discharge: 2013-03-13 | Disposition: A | Discharge status: Home / Self Care | Payer: Medicare Other | Source: Ambulatory Visit | Attending: Internal Medicine | Admitting: Internal Medicine

## 2013-03-13 VITALS — BP 140/82 | HR 90 | Temp 98.0°F | Wt 100.0 lb

## 2013-03-13 DIAGNOSIS — R918 Other nonspecific abnormal finding of lung field: Secondary | ICD-10-CM

## 2013-03-13 DIAGNOSIS — R1319 Other dysphagia: Secondary | ICD-10-CM

## 2013-03-13 DIAGNOSIS — R9389 Abnormal findings on diagnostic imaging of other specified body structures: Secondary | ICD-10-CM

## 2013-03-13 MED ORDER — MOXIFLOXACIN HCL 400 MG PO TABS: 400.0000 mg | ORAL_TABLET | Freq: Every day | ORAL | Status: DC

## 2013-03-13 NOTE — Assessment & Plan Note (Signed)
Addendum: Chest x-ray showed atelectatic versus pneumonia, on clinical grounds most likely is atelectasis however she may have aspirated. Plan Discussed with the patient:  Avelox Followup with PCP in 4-5 weeks, we'll need to repeat a chest x-ray Call if fever or chills

## 2013-03-13 NOTE — Assessment & Plan Note (Addendum)
80 year old lady with history of GERD and a remote  EGD (for similar symptoms?) presents with 3 weeks history of dysphagia, has loss around 8-10 pounds. Plan: Needs to see GI,   will refer her For completeness we'll get chest x-ray Increase OTC Prilosec from one tablet to 2 tablets daily Recommend to eat small bites of soft food and clear liquids.

## 2013-03-13 NOTE — Progress Notes (Signed)
Pre visit review using our clinic review tool, if applicable. No additional management support is needed unless otherwise documented below in the visit note. 

## 2013-03-13 NOTE — Progress Notes (Signed)
   Subjective:    Patient ID: Beth Collier, female    DOB: 1930/09/21, 80 y.o.   MRN: [id number]  HPI Acute visit, here with her daughter-in-law Suanne Marker 3 weeks ago started w/  dysphagia to both solids and liquids, symptoms are gradually getting worse. She is trying to eat softer foods yet   feels the bolus get stuck in the throat and often times she regurgitates part of it. Has some difficulty swallowing her pills as well She reports a history of GERD, takes Prilosec OTC daily, symptoms are not completely well-controlled, on average experiences acid reflux once or twice a week. She had a remote EGD, not sure if she had a dilatation.    Past Medical History  Diagnosis Date  . Anxiety   . Depression   . Osteoporosis   . Hyperlipidemia   . Hypertension   . GERD (gastroesophageal reflux disease)    Past Surgical History  Procedure Laterality Date  . Lumbar laminectomy     History  Substance Use Topics  . Smoking status: Never Smoker   . Smokeless tobacco: Never Used  . Alcohol Use: No    Review of Systems I notice she has lost 8-10 pounds lately. Denies fever or chills Mild nausea sometimes, no vomiting, no diarrhea or blood in the stools. She admits to mild cough after eating. Has not taking Motrin anytime recently.    Objective:   Physical Exam BP 140/82  Pulse 90  Temp(Src) 98 F (36.7 C)  Wt 100 lb (45.36 kg)  SpO2 92% General -- alert, well-developed, NAD.  Neck --no thyromegaly , normal carotid pulse, no LAD or supraclavicular mass  HEENT-- Not pale.   Lungs -- normal respiratory effort, no intercostal retractions, no accessory muscle use, and normal breath sounds.  Heart-- normal rate, regular rhythm, no murmur.  Abdomen-- Not distended, good bowel sounds,soft, non-tender.  Extremities-- no pretibial edema bilaterally  Neurologic--  alert & oriented X3. Speech normal, gait normal. Psych-- Cognition and judgment appear intact. Cooperative with normal attention  span and concentration. No anxious or depressed appearing.      Assessment & Plan:

## 2013-03-13 NOTE — Patient Instructions (Signed)
Please get your x-ray at the other [facility]  office located at: [place], across from [facility].  Please go to the basement, this is a walk-in facility, they are open from 8:30 to 5:30 PM. Phone number [phone].  Increase Prilosec to 2 tablets daily We are referring you to GI, in the meantime if you get worse, have increase cough or symptoms:  please let us know

## 2013-03-19 ENCOUNTER — Encounter: Payer: Self-pay | Admitting: Internal Medicine

## 2013-03-19 ENCOUNTER — Ambulatory Visit (INDEPENDENT_AMBULATORY_CARE_PROVIDER_SITE_OTHER): Payer: Medicare Other | Admitting: Internal Medicine

## 2013-03-19 VITALS — BP 143/91 | HR 86 | Temp 97.9°F | Wt 98.0 lb

## 2013-03-19 VITALS — BP 122/68 | HR 72 | Ht 58.5 in | Wt 98.2 lb

## 2013-03-19 DIAGNOSIS — R1310 Dysphagia, unspecified: Secondary | ICD-10-CM

## 2013-03-19 DIAGNOSIS — R1319 Other dysphagia: Secondary | ICD-10-CM

## 2013-03-19 DIAGNOSIS — R634 Abnormal weight loss: Secondary | ICD-10-CM

## 2013-03-19 NOTE — Progress Notes (Signed)
Pre visit review using our clinic review tool, if applicable. No additional management support is needed unless otherwise documented below in the visit note. 

## 2013-03-19 NOTE — Assessment & Plan Note (Addendum)
Cause not clear. She's having solid and liquid dysphagia and associated weight loss. I discussed the options of upper GI endoscopy versus barium swallow. We both agreed that an upper GI endoscopy with possible esophageal dilation may the most sense. Differential diagnosis includes benign stricture or web, dysmotility or malignancy of the esophagus or upper GI tract. Her daughter-in-law asked about a hiatal hernia, that could cause dysphagia but only limits large usually and I don't think that's the case.The risks and benefits as well as alternatives of endoscopic procedure(s) have been discussed and reviewed. All questions answered. The patient agrees to proceed. Procedure is scheduled for 4 PM Thursday, January 29.

## 2013-03-19 NOTE — Progress Notes (Signed)
Subjective:  Referred by: Kathlene November, MD  Patient ID: Beth Collier, female    DOB: [birth year], 80 y.o.   MRN: [id number]  HPI Patient is a very nice elderly woman here with her daughter-in-law because of her one-month history of solid and liquid dysphagia. This has been accompanied by a 10 pound weight loss she says. She says she'll drink water and to go halfway down and chew regurgitate. Solid food, causes the same sort of problems. She does admit to having had some problems in the past but cannot remember the details. Initially she thought she had an endoscopy but searching a record shows that she had a barium swallow in 2003 that demonstrated tertiary contractions and a cervical esophageal web. The web was thought to be insignificant by the radiologist. Additional symptoms include heartburn. A chest x-ray has shown atelectasis versus pneumonia, and a right greater than left pleural effusion. She has been prescribed Avelox. Her daughter-in-law participates in the history today. Note that the patient has lost about 7 pounds since 1212. This is unintentional weight loss. Allergies  Allergen Reactions  . Cephalexin Hives and Swelling    Dentist gave  . Clindamycin Hives and Swelling    Dentist gave  . Codeine Hives and Swelling  . Demerol [Meperidine] Nausea And Vomiting  . Hydrocodone-Acetaminophen     Might have been give in ER  . Penicillins Hives and Swelling   Outpatient Prescriptions Prior to Visit  Medication Sig Dispense Refill  . ALPRAZolam (XANAX) 0.5 MG tablet TAKE ONE TABLET BY MOUTH ONCE DAILY AT BEDTIME AS NEEDED FOR SLEEP  90 tablet  1  . aspirin 81 MG EC tablet Take 81 mg by mouth at bedtime.       Marland Kitchen atenolol (TENORMIN) 25 MG tablet Take 1 tablet (25 mg total) by mouth daily.  90 tablet  1  . atorvastatin (LIPITOR) 40 MG tablet Take 40 mg by mouth daily.      Marland Kitchen CALCIUM PO Take by mouth. 1800 mg daily       . Cholecalciferol (VITAMIN D3) 1000 UNITS CAPS Take 3,000  Units by mouth daily.       Marland Kitchen denosumab (PROLIA) 60 MG/ML SOLN injection Inject 60 mg into the skin once. Administer in upper arm, thigh, or abdomen  1 mL    . imipramine (TOFRANIL) 25 MG tablet Take 3 tablets (75 mg total) by mouth at bedtime.  270 tablet  1  . loratadine (CLARITIN) 10 MG tablet Take 10 mg by mouth daily.        Marland Kitchen moxifloxacin (AVELOX) 400 MG tablet Take 1 tablet (400 mg total) by mouth daily.  7 tablet  0  . omeprazole (PRILOSEC OTC) 20 MG tablet Take 40 mg by mouth daily.       . pentazocine-acetaminophen (TALACEN) 25-650 MG TABS Take 1 tablet by mouth 3 (three) times daily.      Marland Kitchen zolpidem (AMBIEN) 5 MG tablet Take 1 tablet (5 mg total) by mouth at bedtime as needed for sleep.  30 tablet  1   No facility-administered medications prior to visit.   Past Medical History  Diagnosis Date  . Anxiety   . Depression   . Osteoporosis   . Hyperlipidemia   . Hypertension   . GERD (gastroesophageal reflux disease)    Past Surgical History  Procedure Laterality Date  . Lumbar laminectomy    . Appendectomy    . Colonoscopy  History   Social History  . Marital Status: Widowed    Spouse Name: N/A    Number of Children: 1  . Years of Education: N/A   Occupational History  . 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 Smoking status: Never Smoker   . Smokeless tobacco: Never Used  . Alcohol Use: No  . Drug Use: No  . Sexual Activity: Not Currently    Partners: Male   Other Topics Concern  . None   Social History Narrative   Widowed, retired. She has 1 son.   Drinks one caffeinated beverage daily.   Family History  Problem Relation Age of Onset  . Breast cancer      <50  . Ovarian cancer    . Coronary artery disease      <60  . Depression    . Arthritis    . Alcohol abuse    . Osteoporosis Mother   . Alzheimer's disease Mother   . Mental illness Mother     alzheimers  . Celiac disease Father   . Cancer Other      breast, ovarian   Review of Systems As per history of present illness. Also positive for back pain and arthritis some cough and allergy trouble. All other review of systems are negative.    Objective:   Physical Exam General:  Well-developed, well-nourished and in no acute distress - petite elderly white woman Eyes:  anicteric. ENT:   Mouth and posterior pharynx free of lesions.  Neck:   supple w/o thyromegaly or mass.  Lungs: Clear to auscultation bilaterally. Heart:  S1S2, no rubs, murmurs, gallops. Abdomen:  soft, non-tender, no hepatosplenomegaly, hernia, or mass and BS+.  Lymph:  no cervical or supraclavicular adenopathy. Extremities:   no edema Skin   no rash. Psych:  appropriate mood and  Affect.   Data Reviewed: Images have been personally reviewed also, I have reviewed primary care notes and labs flowed in the electronic medical record. CLINICAL DATA: Dysphasia.  EXAM:  CHEST 2 VIEW  COMPARISON: None.  FINDINGS:  Right lower lobe and right mid lobe atelectasis and/or infiltrates  noted. Right pleural effusion present. Small left pleural effusion  cannot be excluded. No pneumothorax. Heart size and pulmonary  vascularity normal. Degenerative changes and scoliosis thoracic  spine. Degenerative changes both shoulders. No acute bony  abnormality.  IMPRESSION:  1. Prominent atelectasis and/or infiltrates in the lower lobe and  possibly right middle lobe. Associated right pleural effusion.  2. Tiny left pleural effusion.  Electronically Signed  By: Marcello Moores Register  On: 03/13/2013 16:37  Wt Readings from Last 3 Encounters:  03/19/13 98 lb 4 oz (44.566 kg)  03/19/13 98 lb (44.453 kg)  03/13/13 100 lb (45.36 kg)        Assessment & Plan:   1. Dysphagia   2. weight loss 7 pounds since mid-December  I appreciate the opportunity to care for this patient. CC: Garnet Koyanagi, DO and Kathlene November, MD

## 2013-03-19 NOTE — Assessment & Plan Note (Signed)
7 pound weight loss in 5-6 weeks in a very petite woman in the setting of new dysphagia. Await upper endoscopy.

## 2013-03-19 NOTE — Progress Notes (Signed)
   Subjective:    Patient ID: Beth Collier, female    DOB: 1930-06-24, 80 y.o.   MRN: [id number]  HPI Acute visit, here with on the Since last week, she's not getting better, GI eval pending.  Past Medical History  Diagnosis Date  . Anxiety   . Depression   . Osteoporosis   . Hyperlipidemia   . Hypertension   . GERD (gastroesophageal reflux disease)    Past Surgical History  Procedure Laterality Date  . Lumbar laminectomy    . Appendectomy    . Colonoscopy      Review of Systems Chest x-ray was abnormal, she's taking antibiotics. Denies fever or chills. No shortness or breath. She continue difficulty swallowing, sometimes even water. Admits to mild dizziness which is not new sx, no orthostatic sx     Objective:   Physical Exam  BP 143/91  Pulse 86  Temp(Src) 97.9 F (36.6 C)  Wt 98 lb (44.453 kg)  SpO2 93% General -- alert, well-developed, NAD.  Neck -- no LAD, mass, trachea midline HEENT-- Not pale.  Oral membranes moist Extremities-- no pretibial edema bilaterally  Psych-- Cognition and judgment appear intact. Cooperative with normal attention span and concentration. No anxious or depressed appearing.      Assessment & Plan:  Today , I spent more than 15 min with the patient, >50% of the time counseling, and coordinating her care

## 2013-03-19 NOTE — Patient Instructions (Signed)
Dr Carlean Purl will see you at 3.30 pm today, please arrive 20 minutes earlier  office located at: [place], across from [facility].  3th floor

## 2013-03-19 NOTE — Patient Instructions (Signed)
You have been scheduled for an endoscopy with propofol. Please follow written instructions given to you at your visit today. If you use inhalers (even only as needed), please bring them with you on the day of your procedure. Your physician has requested that you go to www.startemmi.com and enter the access code given to you at your visit today. This web site gives a general overview about your procedure. However, you should still follow specific instructions given to you by our office regarding your preparation for the procedure.  I appreciate the opportunity to care for you.  

## 2013-03-19 NOTE — Assessment & Plan Note (Signed)
Continue with symptoms, has lost additional  2 pounds. I called GI and   Scheduled a visit for this afternoon, I appreciate GI help

## 2013-03-20 ENCOUNTER — Encounter: Payer: Self-pay | Admitting: Internal Medicine

## 2013-03-21 ENCOUNTER — Ambulatory Visit (AMBULATORY_SURGERY_CENTER): Payer: Medicare Other | Admitting: Internal Medicine

## 2013-03-21 ENCOUNTER — Other Ambulatory Visit (INDEPENDENT_AMBULATORY_CARE_PROVIDER_SITE_OTHER): Payer: Medicare Other

## 2013-03-21 ENCOUNTER — Encounter: Payer: Self-pay | Admitting: Internal Medicine

## 2013-03-21 VITALS — BP 143/96 | HR 91 | Temp 96.8°F | Resp 18 | Ht <= 58 in | Wt 98.0 lb

## 2013-03-21 DIAGNOSIS — K222 Esophageal obstruction: Secondary | ICD-10-CM

## 2013-03-21 DIAGNOSIS — R1310 Dysphagia, unspecified: Secondary | ICD-10-CM

## 2013-03-21 LAB — BUN: BUN: 18 mg/dL (ref 6–23)

## 2013-03-21 LAB — CREATININE, SERUM: CREATININE: 0.6 mg/dL (ref 0.4–1.2)

## 2013-03-21 MED ORDER — SODIUM CHLORIDE 0.9 % IV SOLN: 500.0000 mL | INTRAVENOUS | Status: DC

## 2013-03-21 NOTE — Patient Instructions (Addendum)
YOU HAD AN ENDOSCOPIC PROCEDURE TODAY AT THE  ENDOSCOPY CENTER: Refer to the procedure report that was given to you for any specific questions about what was found during the examination.  If the procedure report does not answer your questions, please call your gastroenterologist to clarify.  If you requested that your care partner not be given the details of your procedure findings, then the procedure report has been included in a sealed envelope for you to review at your convenience later.  YOU SHOULD EXPECT: Some feelings of bloating in the abdomen. Passage of more gas than usual.  Walking can help get rid of the air that was put into your GI tract during the procedure and reduce the bloating. If you had a lower endoscopy (such as a colonoscopy or flexible sigmoidoscopy) you may notice spotting of blood in your stool or on the toilet paper. If you underwent a bowel prep for your procedure, then you may not have a normal bowel movement for a few days.  DIET: Your first meal following the procedure should be a light meal and then it is ok to progress to your normal diet.  A half-sandwich or bowl of soup is an example of a good first meal.  Heavy or fried foods are harder to digest and may make you feel nauseous or bloated.  Likewise meals heavy in dairy and vegetables can cause extra gas to form and this can also increase the bloating.  Drink plenty of fluids but you should avoid alcoholic beverages for 24 hours.  ACTIVITY: Your care partner should take you home directly after the procedure.  You should plan to take it easy, moving slowly for the rest of the day.  You can resume normal activity the day after the procedure however you should NOT DRIVE or use heavy machinery for 24 hours (because of the sedation medicines used during the test).    SYMPTOMS TO REPORT IMMEDIATELY: A gastroenterologist can be reached at any hour.  During normal business hours, 8:30 AM to 5:00 PM Monday through Friday,  call [phone].  After hours and on weekends, please call the GI answering service at [phone] who will take a message and have the physician on call contact you.     Following upper endoscopy (EGD)  Vomiting of blood or coffee ground material  New chest pain or pain under the shoulder blades  Painful or persistently difficult swallowing  New shortness of breath  Fever of 100F or higher  Black, tarry-looking stools  FOLLOW UP: If any biopsies were taken you will be contacted by phone or by letter within the next 1-3 weeks.  Call your gastroenterologist if you have not heard about the biopsies in 3 weeks.  Our staff will call the home number listed on your records the next business day following your procedure to check on you and address any questions or concerns that you may have at that time regarding the information given to you following your procedure. This is a courtesy call and so if there is no answer at the home number and we have not heard from you through the emergency physician on call, we will assume that you have returned to your regular daily activities without incident.  SIGNATURES/CONFIDENTIALITY: You and/or your care partner have signed paperwork which will be entered into your electronic medical record.  These signatures attest to the fact that that the information above on your After Visit Summary has been reviewed and is understood.    Full responsibility of the confidentiality of this discharge information lies with you and/or your care-partner.   Clear liquids until 5 PM then soft foods remainder of the day.  May try regular diet tomorrow.   You have been scheduled for a CT scan of the abdomen and pelvis at [facility] ([address]---this is in the same building as Press photographer).   You are scheduled on 03/25/13 at 2:30pm. You should arrive 15 minutes prior to your appointment time for registration. Please follow the written instructions  below on the day of your exam:  WARNING: IF YOU ARE ALLERGIC TO IODINE/X-RAY DYE, PLEASE NOTIFY RADIOLOGY IMMEDIATELY AT [phone]! YOU WILL BE GIVEN A 13 HOUR PREMEDICATION PREP.  1) Do not eat or drink anything after 12:30pm (2 hours prior to your test)  This test typically takes 30-45 minutes to complete.  If you have any questions regarding your exam or if you need to reschedule, you may call the CT department at [phone] between the hours of 8:00 am and 5:00 pm, Monday-Friday.  ________________________________________________________________________

## 2013-03-21 NOTE — Progress Notes (Signed)
BUN and Creatinine drawn prior to discharge.

## 2013-03-21 NOTE — Progress Notes (Signed)
Called to room to assist during endoscopic procedure.  Patient ID and intended procedure confirmed with present staff. Received instructions for my participation in the procedure from the performing physician.  

## 2013-03-21 NOTE — Progress Notes (Signed)
Lidocaine-40mg IV prior to Propofol InductionPropofol given over incremental dosages 

## 2013-03-21 NOTE — Op Note (Addendum)
[facility]  [address] [place], [address]   ENDOSCOPY PROCEDURE REPORT  PATIENT: Beth, Collier  MR#: [id number] BIRTHDATE: 1930-08-30 , 82  yrs. old GENDER: Female ENDOSCOPIST: Gatha Mayer, MD, [facility] REFERRED BY:  Kathlene November, M.D. PROCEDURE DATE:  03/21/2013 PROCEDURE:  EGD, diagnostic and Maloney dilation of esophagus ASA CLASS:     Class III INDICATIONS:  Dysphagia. MEDICATIONS: propofol (Diprivan) 50mg  IV, MAC sedation, administered by CRNA, and These medications were titrated to patient response per physician's verbal order TOPICAL ANESTHETIC: Cetacaine Spray  DESCRIPTION OF PROCEDURE: After the risks benefits and alternatives of the procedure were thoroughly explained, informed consent was obtained.  The LB [id number] endoscope was introduced through the mouth and advanced to the second portion of the duodenum. Without limitations.  The instrument was slowly withdrawn as the mucosa was fully examined.        ESOPHAGUS: A stenosis was found in the middle third of the esophagus.  The stenosis was traversable with the endoscope. Suspected extrinsic compression.  Retroflexed views revealed no abnormalities.     The scope was then withdrawn from the patient, 44 and 40 Fr Maloney dilators passed with slight heme and no clear dilation effect on reinspection.Then  the procedure was completed.  COMPLICATIONS: There were no complications. ENDOSCOPIC IMPRESSION: Stenosis was found in the middle third of the esophagus suspect extrinsic compression  RECOMMENDATIONS: [place] CT chest with contrast re: esophageal stenosis suspect extrinsic compression Clear liquids until 5 PM then soft foods. May try regular diet tomorrow.   eSigned:  Gatha Mayer, MD, [facility] 03/21/2013 4:18 PM Revised: 03/21/2013 4:18 PM  HK:UVJD Larose Kells, MD, Rosalita Chessman, DO, and The Patient

## 2013-03-22 ENCOUNTER — Telehealth: Payer: Self-pay

## 2013-03-22 NOTE — Telephone Encounter (Signed)
  Follow up Call-  Call back number 03/21/2013  Post procedure Call Back phone  # [phone]  Permission to leave phone message Yes     Patient questions:  Do you have a fever, pain , or abdominal swelling? no Pain Score  0 *  Have you tolerated food without any problems? yes  Have you been able to return to your normal activities? yes  Do you have any questions about your discharge instructions: Diet   no Medications  no Follow up visit  no  Do you have questions or concerns about your Care? no  Actions: * If pain score is 4 or above: No action needed, pain <4.

## 2013-03-25 ENCOUNTER — Ambulatory Visit (INDEPENDENT_AMBULATORY_CARE_PROVIDER_SITE_OTHER)
Admission: RE | Admit: 2013-03-25 | Discharge: 2013-03-25 | Disposition: A | Discharge status: Home / Self Care | Payer: Medicare Other | Source: Ambulatory Visit | Attending: Internal Medicine | Admitting: Internal Medicine

## 2013-03-25 DIAGNOSIS — K222 Esophageal obstruction: Secondary | ICD-10-CM

## 2013-03-25 MED ORDER — IOHEXOL 300 MG/ML  SOLN
80.0000 mL | Freq: Once | INTRAMUSCULAR | Status: AC | PRN
  Administered 2013-03-25: 80 mL via INTRAVENOUS

## 2013-03-26 ENCOUNTER — Other Ambulatory Visit: Payer: Self-pay | Admitting: Family Medicine

## 2013-03-26 ENCOUNTER — Other Ambulatory Visit: Payer: Self-pay

## 2013-03-26 ENCOUNTER — Encounter: Payer: Self-pay | Admitting: Internal Medicine

## 2013-03-26 DIAGNOSIS — R918 Other nonspecific abnormal finding of lung field: Secondary | ICD-10-CM

## 2013-03-26 NOTE — Progress Notes (Signed)
Quick Note:  I called results to patient and explained that she probably has lung cancer though we have not proven that.  She needs a pulmonary appointment soon please re: hilar mass, pleural effusion - suspect cancer ______

## 2013-03-27 ENCOUNTER — Encounter: Payer: Self-pay | Admitting: Internal Medicine

## 2013-03-27 ENCOUNTER — Ambulatory Visit (INDEPENDENT_AMBULATORY_CARE_PROVIDER_SITE_OTHER): Payer: Medicare Other | Admitting: Internal Medicine

## 2013-03-27 VITALS — BP 104/80 | HR 93 | Temp 97.5°F | Ht <= 58 in | Wt 96.0 lb

## 2013-03-27 DIAGNOSIS — R918 Other nonspecific abnormal finding of lung field: Secondary | ICD-10-CM

## 2013-03-27 DIAGNOSIS — R222 Localized swelling, mass and lump, trunk: Secondary | ICD-10-CM

## 2013-03-27 NOTE — Patient Instructions (Signed)
Come to outpatient registration at [facility] (behind the ER) at 715 am Friday 03/29/13 with nothing to eat or drink after midnight Thursday.for Bronchoscopy

## 2013-03-27 NOTE — Progress Notes (Signed)
Subjective:    Patient ID: Beth Collier, female    DOB: 02-12-1931  MRN: [id number]  HPI   70 yowf never smoker with new onset dysphagia referred 03/27/2013 to pulmonary clinic by Dr Carlean Purl for lung mass.   03/27/2013 1st [facility] office visit/ Beth Collier cc new onset dysphagia  X one month prior to OV  With extrinsic compression by EGD 1/291/5 > CT chest 03/25/13 c/w R lung mass.  Pt swallowing a little better a dilation but very weak, not getting out much at all. No cough or limiting sob   No obvious day to day or daytime variabilty or assoc chronic cough or cp or chest tightness, subjective wheeze overt sinus  symptoms. No unusual exp hx or h/o childhood pna/ asthma or knowledge of premature birth.  Sleeping ok without nocturnal  or early am exacerbation  of respiratory  c/o's or need for noct saba. Also denies any obvious fluctuation of symptoms with weather or environmental changes or other aggravating or alleviating factors except as outlined above   Current Medications, Allergies, Complete Past Medical History, Past Surgical History, Family History, and Social History were reviewed in [employer] record.       Review of Systems  Constitutional: Positive for unexpected weight change. Negative for fever and chills.  HENT: Positive for trouble swallowing. Negative for congestion, dental problem, ear pain, nosebleeds, postnasal drip, rhinorrhea, sinus pressure, sneezing, sore throat and voice change.   Eyes: Negative for visual disturbance.  Respiratory: Negative for cough, choking and shortness of breath.   Cardiovascular: Negative for chest pain and leg swelling.  Gastrointestinal: Negative for vomiting, abdominal pain and diarrhea.  Genitourinary: Negative for difficulty urinating.       Heartburn  Musculoskeletal: Negative for arthralgias.  Skin: Negative for rash.  Neurological: Positive for headaches. Negative for tremors and syncope.  Hematological:  Does not bruise/bleed easily.       Objective:   Physical Exam  amb wf chronically ill nad   Wt Readings from Last 3 Encounters:  03/27/13 96 lb (43.545 kg)  03/21/13 98 lb (44.453 kg)  03/19/13 98 lb 4 oz (44.566 kg)     HEENT: nl dentition, turbinates, and orophanx. Nl external ear canals without cough reflex   NECK :  without JVD/Nodes/TM/ nl carotid upstrokes bilaterally   LUNGS: no acc muscle use, clear to A and P bilaterally without cough on insp or exp maneuvers   CV:  RRR  no s3 or murmur or increase in P2, no edema   ABD:  soft and nontender with nl excursion in the supine position. No bruits or organomegaly, bowel sounds nl  MS:  warm without deformities, calf tenderness, cyanosis or clubbing  SKIN: warm and dry without lesions    NEURO:  alert, approp, no deficits    No baseline cxr x 10 years  CT chest 03/26/13 1. There is a large right hilar mass worrisome for malignancy. Its  medial border abuts the lower 1/3 of the thoracic esophagus. There  is also a large right pleural effusion. There is atelectasis of  portions of the right lung adjacent to the hilar mass and pleural  effusion.  2. There are multiple subcentimeter nodules within the otherwise  normal-appearing left lung worrisome for intra- pulmonary metastatic  disease.  3. There are borderline to mildly enlarged mediastinal lymph nodes.  4. There is enlargement of the left adrenal gland worrisome for  metastatic disease.  Assessment & Plan:

## 2013-03-27 NOTE — Assessment & Plan Note (Addendum)
DDX does include lung ca but in never smoker a lymphoma would be higher on ddx and more treatable.  Discussed in detail all the  indications, usual  risks and alternatives  relative to the benefits with patient who agrees to proceed with bronchoscopy with biopsy on 03/29/13 -  Emphasized that priority for now is tissue dx the easiest possible way and then talk about limited treatment options and prognosis.

## 2013-03-29 ENCOUNTER — Encounter (HOSPITAL_COMMUNITY)
Admission: RE | Disposition: A | Discharge status: Home / Self Care | Payer: Medicare Other | Source: Ambulatory Visit | Attending: Internal Medicine

## 2013-03-29 ENCOUNTER — Encounter (HOSPITAL_COMMUNITY): Payer: Self-pay | Admitting: Respiratory Therapy

## 2013-03-29 ENCOUNTER — Ambulatory Visit (HOSPITAL_COMMUNITY)
Admission: RE | Admit: 2013-03-29 | Discharge: 2013-03-29 | Disposition: A | Discharge status: Home / Self Care | Payer: Medicare Other | Source: Ambulatory Visit | Attending: Internal Medicine | Admitting: Internal Medicine

## 2013-03-29 DIAGNOSIS — R9389 Abnormal findings on diagnostic imaging of other specified body structures: Secondary | ICD-10-CM

## 2013-03-29 DIAGNOSIS — R918 Other nonspecific abnormal finding of lung field: Secondary | ICD-10-CM

## 2013-03-29 DIAGNOSIS — C3430 Malignant neoplasm of lower lobe, unspecified bronchus or lung: Secondary | ICD-10-CM | POA: Insufficient documentation

## 2013-03-29 DIAGNOSIS — R222 Localized swelling, mass and lump, trunk: Secondary | ICD-10-CM

## 2013-03-29 DIAGNOSIS — J90 Pleural effusion, not elsewhere classified: Secondary | ICD-10-CM | POA: Insufficient documentation

## 2013-03-29 DIAGNOSIS — R040 Epistaxis: Secondary | ICD-10-CM | POA: Insufficient documentation

## 2013-03-29 DIAGNOSIS — R1310 Dysphagia, unspecified: Secondary | ICD-10-CM | POA: Insufficient documentation

## 2013-03-29 HISTORY — PX: VIDEO BRONCHOSCOPY: SHX5072

## 2013-03-29 SURGERY — VIDEO BRONCHOSCOPY WITHOUT FLUORO
Anesthesia: Moderate Sedation | Laterality: Bilateral

## 2013-03-29 MED ORDER — FENTANYL CITRATE 0.05 MG/ML IJ SOLN
INTRAMUSCULAR | Status: AC
  Filled 2013-03-29: qty 4

## 2013-03-29 MED ORDER — PHENYLEPHRINE HCL 0.25 % NA SOLN
NASAL | Status: DC | PRN
  Administered 2013-03-29: 2 via NASAL

## 2013-03-29 MED ORDER — LIDOCAINE HCL 2 % EX GEL
CUTANEOUS | Status: DC | PRN
  Administered 2013-03-29: 1

## 2013-03-29 MED ORDER — SODIUM CHLORIDE 0.9 % IV SOLN
INTRAVENOUS | Status: DC
  Administered 2013-03-29: 10 mL/h via INTRAVENOUS

## 2013-03-29 MED ORDER — LIDOCAINE HCL 1 % IJ SOLN
INTRAMUSCULAR | Status: DC | PRN
  Administered 2013-03-29: 2 mL

## 2013-03-29 MED ORDER — FENTANYL CITRATE 0.05 MG/ML IJ SOLN: 25.0000 ug | Freq: Once | INTRAMUSCULAR | Status: DC

## 2013-03-29 MED ORDER — FENTANYL CITRATE 0.05 MG/ML IJ SOLN
INTRAMUSCULAR | Status: DC | PRN
  Administered 2013-03-29 (×2): 25 ug via INTRAVENOUS

## 2013-03-29 MED ORDER — MIDAZOLAM HCL 5 MG/ML IJ SOLN: 1.0000 mg | Freq: Once | INTRAMUSCULAR | Status: DC

## 2013-03-29 MED ORDER — LIDOCAINE HCL 2 % EX GEL
Freq: Once | CUTANEOUS | Status: DC
  Filled 2013-03-29: qty 5

## 2013-03-29 MED ORDER — MIDAZOLAM HCL 10 MG/2ML IJ SOLN
INTRAMUSCULAR | Status: DC | PRN
  Administered 2013-03-29 (×2): 2.5 mg via INTRAVENOUS

## 2013-03-29 MED ORDER — MIDAZOLAM HCL 10 MG/2ML IJ SOLN
INTRAMUSCULAR | Status: AC
  Filled 2013-03-29: qty 4

## 2013-03-29 MED ORDER — PHENYLEPHRINE HCL 0.25 % NA SOLN
1.0000 | Freq: Four times a day (QID) | NASAL | Status: DC | PRN
  Filled 2013-03-29: qty 15

## 2013-03-29 NOTE — Discharge Instructions (Signed)
Flexible Bronchoscopy, Care After These instructions give you information on caring for yourself after your procedure. Your doctor may also give you more specific instructions. Call your doctor if you have any problems or questions after your procedure. HOME CARE  Do not eat or drink anything for 2 hours after your procedure. If you try to eat or drink before the medicine wears off, food or drink could go into your lungs. You could also burn yourself.  After 2 hours have passed and when you can cough and gag normally, you may eat soft food and drink liquids slowly.  The day after the test, you may eat your normal diet.  You may do your normal activities.  Keep all doctor visits. GET HELP RIGHT AWAY  You get more and more short of breath.  You get lightheaded.  You feel like you are going to pass out (faint).  You have chest pain.  You have new problems that worry you.  You cough up more than a little blood.  You cough up more blood than before. MAKE SURE YOU:  Understand these instructions.  Will watch your condition.  Will get help right away if you are not doing well or get worse. Document Released: 12/05/2008 Document Revised: 11/28/2012 Document Reviewed: 10/12/2012 [facility] Patient Information 2014 [place].  Do not eat or drink anything until 1030am today February 6,2015.

## 2013-03-29 NOTE — H&P (Signed)
HPI     25 yowf never smoker with new onset dysphagia referred 03/27/2013 to pulmonary clinic by Dr Carlean Purl for lung mass.     03/27/2013 1st [facility] office visit/ Beth Collier cc new onset dysphagia  X one month prior to OV  With extrinsic compression by EGD 1/291/5 > CT chest 03/25/13 c/w R lung mass.  Pt swallowing a little better a dilation but very weak, not getting out much at all. No cough or limiting sob    No obvious day to day or daytime variabilty or assoc chronic cough or cp or chest tightness, subjective wheeze overt sinus  symptoms. No unusual exp hx or h/o childhood pna/ asthma or knowledge of premature birth.   Sleeping ok without nocturnal  or early am exacerbation  of respiratory  c/o's or need for noct saba. Also denies any obvious fluctuation of symptoms with weather or environmental changes or other aggravating or alleviating factors except as outlined above    Current Medications, Allergies, Complete Past Medical History, Past Surgical History, Family History, and Social History were reviewed in [employer] record.           Review of Systems  Constitutional: Positive for unexpected weight change. Negative for fever and chills.  HENT: Positive for trouble swallowing. Negative for congestion, dental problem, ear pain, nosebleeds, postnasal drip, rhinorrhea, sinus pressure, sneezing, sore throat and voice change.   Eyes: Negative for visual disturbance.  Respiratory: Negative for cough, choking and shortness of breath.   Cardiovascular: Negative for chest pain and leg swelling.  Gastrointestinal: Negative for vomiting, abdominal pain and diarrhea.  Genitourinary: Negative for difficulty urinating.        Heartburn  Musculoskeletal: Negative for arthralgias.  Skin: Negative for rash.  Neurological: Positive for headaches. Negative for tremors and syncope.  Hematological: Does not bruise/bleed easily.          Objective:      Physical Exam   amb wf chronically ill nad     Wt Readings from Last 3 Encounters:   03/27/13  96 lb (43.545 kg)   03/21/13  98 lb (44.453 kg)   03/19/13  98 lb 4 oz (44.566 kg)        HEENT: nl dentition, turbinates, and orophanx. Nl external ear canals without cough reflex     NECK :  without JVD/Nodes/TM/ nl carotid upstrokes bilaterally     LUNGS: no acc muscle use, clear to A and P bilaterally without cough on insp or exp maneuvers     CV:  RRR  no s3 or murmur or increase in P2, no edema    ABD:  soft and nontender with nl excursion in the supine position. No bruits or organomegaly, bowel sounds nl   MS:  warm without deformities, calf tenderness, cyanosis or clubbing   SKIN: warm and dry without lesions     NEURO:  alert, approp, no deficits     No baseline cxr x 10 years   CT chest 03/26/13 1. There is a large right hilar mass worrisome for malignancy. Its   medial border abuts the lower 1/3 of the thoracic esophagus. There   is also a large right pleural effusion. There is atelectasis of   portions of the right lung adjacent to the hilar mass and pleural   effusion.   2. There are multiple subcentimeter nodules within the otherwise   normal-appearing left lung worrisome for intra- pulmonary metastatic   disease.   3.  There are borderline to mildly enlarged mediastinal lymph nodes.   4. There is enlargement of the left adrenal gland worrisome for   metastatic disease.            Assessment & Plan:       Hilar mass-right -       DDX does include lung ca but in never smoker a lymphoma would be higher on ddx and more treatable.   Discussed in detail all the  indications, usual  risks and alternatives  relative to the benefits with patient who agrees to proceed with bronchoscopy with biopsy on 03/29/13 -  Emphasized that priority for now is tissue dx the easiest possible way and then talk about limited treatment options and prognosis.       .03/29/2013 FOB day no change hx or exam   Beth Gully, MD Pulmonary and [place] [phone] After 5:30 PM or weekends, call [phone]

## 2013-03-29 NOTE — Progress Notes (Signed)
Video Bronchoscopy good patient tolerance  Intervention bronchial washing  Intervention bronchial biopsy

## 2013-03-29 NOTE — Op Note (Signed)
Bronchoscopy Procedure Note  Date of Operation: 03/29/2013   Pre-op Diagnosis: lung mass  Post-op Diagnosis: same  Surgeon: Christinia Gully  Anesthesia: Monitored Local Anesthesia with Sedation  Operation: Video Flexible fiberoptic bronchoscopy, diagnostic   Findings: severe swelling and cobblestoning of all the airways beyond the rml orifice  Specimen: lavage/endobronchial bx  Estimated Blood Loss: min   Complications: min epistaxis  Indications and History: See updated H and P same date. The risks, benefits, complications, treatment options and expected outcomes were discussed with the patient.  The possibilities of reaction to medication, pulmonary aspiration, perforation of a viscus, bleeding, failure to diagnose a condition and creating a complication requiring transfusion or operation were discussed with the patient who freely signed the consent.    Description of Procedure: The patient was re-examined in the bronchoscopy suite and the site of surgery properly noted/marked.  The patient was identified  and the procedure verified as Flexible Fiberoptic Bronchoscopy.  A Time Out was held and the above information confirmed.   After the induction of topical nasopharyngeal anesthesia, the patient was positioned  and the bronchoscope was passed through the R naris. The vocal cords were visualized and  1% buffered lidocaine 5 ml was topically placed onto the cords. The cords were nl. The scope was then passed into the trachea.  1% buffered lidocaine given topically. Airways inspected bilaterally to the subsegmental level with the following findings:  Trachea/carina and R UL all nl  Diffuse erythema with cobblestoning involving all the airways of the RLL and lower portion of the RML orifice as well  Interventions 1) BAL RLL 2) endobronchial bx's RLL    The Patient was taken to the Endoscopy Recovery area in satisfactory condition.  Attestation: I performed the procedure.  Christinia Gully, MD Pulmonary and [place] [phone] After 5:30 PM or weekends, call [phone]

## 2013-03-31 ENCOUNTER — Encounter (HOSPITAL_COMMUNITY): Payer: Self-pay | Admitting: Emergency Medicine

## 2013-03-31 ENCOUNTER — Inpatient Hospital Stay (HOSPITAL_COMMUNITY)
Admission: EM | Admit: 2013-03-31 | Discharge: 2013-04-07 | DRG: 180 | Disposition: A | Discharge status: Hospice | Payer: Medicare Other | Attending: Internal Medicine | Admitting: Internal Medicine

## 2013-03-31 ENCOUNTER — Emergency Department (HOSPITAL_COMMUNITY): Payer: Medicare Other

## 2013-03-31 DIAGNOSIS — C3400 Malignant neoplasm of unspecified main bronchus: Principal | ICD-10-CM | POA: Diagnosis present

## 2013-03-31 DIAGNOSIS — R4702 Dysphasia: Secondary | ICD-10-CM | POA: Diagnosis present

## 2013-03-31 DIAGNOSIS — R404 Transient alteration of awareness: Secondary | ICD-10-CM | POA: Diagnosis not present

## 2013-03-31 DIAGNOSIS — I10 Essential (primary) hypertension: Secondary | ICD-10-CM | POA: Diagnosis present

## 2013-03-31 DIAGNOSIS — E860 Dehydration: Secondary | ICD-10-CM | POA: Diagnosis present

## 2013-03-31 DIAGNOSIS — E876 Hypokalemia: Secondary | ICD-10-CM | POA: Diagnosis present

## 2013-03-31 DIAGNOSIS — R110 Nausea: Secondary | ICD-10-CM | POA: Diagnosis present

## 2013-03-31 DIAGNOSIS — Z79899 Other long term (current) drug therapy: Secondary | ICD-10-CM

## 2013-03-31 DIAGNOSIS — E872 Acidosis, unspecified: Secondary | ICD-10-CM | POA: Diagnosis not present

## 2013-03-31 DIAGNOSIS — R0902 Hypoxemia: Secondary | ICD-10-CM | POA: Diagnosis present

## 2013-03-31 DIAGNOSIS — Z803 Family history of malignant neoplasm of breast: Secondary | ICD-10-CM

## 2013-03-31 DIAGNOSIS — C7931 Secondary malignant neoplasm of brain: Secondary | ICD-10-CM | POA: Diagnosis present

## 2013-03-31 DIAGNOSIS — R918 Other nonspecific abnormal finding of lung field: Secondary | ICD-10-CM

## 2013-03-31 DIAGNOSIS — C7949 Secondary malignant neoplasm of other parts of nervous system: Secondary | ICD-10-CM

## 2013-03-31 DIAGNOSIS — R52 Pain, unspecified: Secondary | ICD-10-CM | POA: Diagnosis not present

## 2013-03-31 DIAGNOSIS — H9190 Unspecified hearing loss, unspecified ear: Secondary | ICD-10-CM | POA: Diagnosis present

## 2013-03-31 DIAGNOSIS — Z66 Do not resuscitate: Secondary | ICD-10-CM | POA: Diagnosis not present

## 2013-03-31 DIAGNOSIS — Z880 Allergy status to penicillin: Secondary | ICD-10-CM

## 2013-03-31 DIAGNOSIS — C3490 Malignant neoplasm of unspecified part of unspecified bronchus or lung: Secondary | ICD-10-CM | POA: Diagnosis present

## 2013-03-31 DIAGNOSIS — F411 Generalized anxiety disorder: Secondary | ICD-10-CM

## 2013-03-31 DIAGNOSIS — K219 Gastro-esophageal reflux disease without esophagitis: Secondary | ICD-10-CM | POA: Diagnosis present

## 2013-03-31 DIAGNOSIS — M545 Low back pain, unspecified: Secondary | ICD-10-CM | POA: Diagnosis present

## 2013-03-31 DIAGNOSIS — Z515 Encounter for palliative care: Secondary | ICD-10-CM

## 2013-03-31 DIAGNOSIS — IMO0002 Reserved for concepts with insufficient information to code with codable children: Secondary | ICD-10-CM

## 2013-03-31 DIAGNOSIS — Z8262 Family history of osteoporosis: Secondary | ICD-10-CM

## 2013-03-31 DIAGNOSIS — T66XXXS Radiation sickness, unspecified, sequela: Secondary | ICD-10-CM

## 2013-03-31 DIAGNOSIS — R634 Abnormal weight loss: Secondary | ICD-10-CM | POA: Diagnosis present

## 2013-03-31 DIAGNOSIS — Z8041 Family history of malignant neoplasm of ovary: Secondary | ICD-10-CM

## 2013-03-31 DIAGNOSIS — E43 Unspecified severe protein-calorie malnutrition: Secondary | ICD-10-CM | POA: Diagnosis present

## 2013-03-31 DIAGNOSIS — Y842 Radiological procedure and radiotherapy as the cause of abnormal reaction of the patient, or of later complication, without mention of misadventure at the time of the procedure: Secondary | ICD-10-CM | POA: Diagnosis present

## 2013-03-31 DIAGNOSIS — R5383 Other fatigue: Secondary | ICD-10-CM

## 2013-03-31 DIAGNOSIS — R1310 Dysphagia, unspecified: Secondary | ICD-10-CM | POA: Diagnosis present

## 2013-03-31 DIAGNOSIS — M810 Age-related osteoporosis without current pathological fracture: Secondary | ICD-10-CM | POA: Diagnosis present

## 2013-03-31 DIAGNOSIS — R599 Enlarged lymph nodes, unspecified: Secondary | ICD-10-CM | POA: Diagnosis present

## 2013-03-31 DIAGNOSIS — E785 Hyperlipidemia, unspecified: Secondary | ICD-10-CM | POA: Diagnosis present

## 2013-03-31 DIAGNOSIS — R5381 Other malaise: Secondary | ICD-10-CM

## 2013-03-31 DIAGNOSIS — M549 Dorsalgia, unspecified: Secondary | ICD-10-CM | POA: Diagnosis present

## 2013-03-31 DIAGNOSIS — J90 Pleural effusion, not elsewhere classified: Secondary | ICD-10-CM | POA: Diagnosis present

## 2013-03-31 DIAGNOSIS — J189 Pneumonia, unspecified organism: Secondary | ICD-10-CM | POA: Diagnosis present

## 2013-03-31 DIAGNOSIS — R1319 Other dysphagia: Secondary | ICD-10-CM

## 2013-03-31 DIAGNOSIS — Z885 Allergy status to narcotic agent status: Secondary | ICD-10-CM

## 2013-03-31 DIAGNOSIS — Z881 Allergy status to other antibiotic agents status: Secondary | ICD-10-CM

## 2013-03-31 DIAGNOSIS — G8929 Other chronic pain: Secondary | ICD-10-CM | POA: Diagnosis present

## 2013-03-31 DIAGNOSIS — R4789 Other speech disturbances: Secondary | ICD-10-CM | POA: Diagnosis present

## 2013-03-31 HISTORY — DX: Other nonspecific abnormal finding of lung field: R91.8

## 2013-03-31 HISTORY — DX: Dysphagia, unspecified: R13.10

## 2013-03-31 LAB — CBC WITH DIFFERENTIAL/PLATELET
BASOS ABS: 0 10*3/uL (ref 0.0–0.1)
Basophils Relative: 1 % (ref 0–1)
EOS ABS: 0.1 10*3/uL (ref 0.0–0.7)
EOS PCT: 1 % (ref 0–5)
HEMATOCRIT: 40.3 % (ref 36.0–46.0)
Hemoglobin: 13.8 g/dL (ref 12.0–15.0)
LYMPHS PCT: 21 % (ref 12–46)
Lymphs Abs: 1 10*3/uL (ref 0.7–4.0)
MCH: 31.5 pg (ref 26.0–34.0)
MCHC: 34.2 g/dL (ref 30.0–36.0)
MCV: 92 fL (ref 78.0–100.0)
Monocytes Absolute: 0.7 10*3/uL (ref 0.1–1.0)
Monocytes Relative: 14 % — ABNORMAL HIGH (ref 3–12)
Neutro Abs: 3.2 10*3/uL (ref 1.7–7.7)
Neutrophils Relative %: 63 % (ref 43–77)
PLATELETS: 316 10*3/uL (ref 150–400)
RBC: 4.38 MIL/uL (ref 3.87–5.11)
RDW: 12.6 % (ref 11.5–15.5)
WBC: 5 10*3/uL (ref 4.0–10.5)

## 2013-03-31 LAB — BASIC METABOLIC PANEL
BUN: 11 mg/dL (ref 6–23)
CALCIUM: 9.2 mg/dL (ref 8.4–10.5)
CO2: 21 meq/L (ref 19–32)
Chloride: 94 mEq/L — ABNORMAL LOW (ref 96–112)
Creatinine, Ser: 0.6 mg/dL (ref 0.50–1.10)
GFR, EST NON AFRICAN AMERICAN: 83 mL/min — AB (ref >90)
Glucose, Bld: 91 mg/dL (ref 70–99)
Potassium: 3.8 mEq/L (ref 3.7–5.3)
SODIUM: 135 meq/L — AB (ref 137–147)

## 2013-03-31 LAB — URINALYSIS, ROUTINE W REFLEX MICROSCOPIC
Glucose, UA: NEGATIVE mg/dL
Hgb urine dipstick: NEGATIVE
Ketones, ur: 40 mg/dL — AB
LEUKOCYTES UA: NEGATIVE
Nitrite: NEGATIVE
PH: 6.5 (ref 5.0–8.0)
PROTEIN: NEGATIVE mg/dL
Specific Gravity, Urine: 1.015 (ref 1.005–1.030)
Urobilinogen, UA: 0.2 mg/dL (ref 0.0–1.0)

## 2013-03-31 LAB — TROPONIN I

## 2013-03-31 LAB — CG4 I-STAT (LACTIC ACID): LACTIC ACID, VENOUS: 2.28 mmol/L — AB (ref 0.5–2.2)

## 2013-03-31 MED ORDER — KETOROLAC TROMETHAMINE 15 MG/ML IJ SOLN
15.0000 mg | Freq: Once | INTRAMUSCULAR | Status: AC
  Administered 2013-03-31: 15 mg via INTRAVENOUS
  Filled 2013-03-31: qty 1

## 2013-03-31 MED ORDER — SODIUM CHLORIDE 0.9 % IV SOLN
INTRAVENOUS | Status: AC
  Administered 2013-03-31: 19:00:00 via INTRAVENOUS

## 2013-03-31 MED ORDER — ONDANSETRON HCL 4 MG PO TABS
4.0000 mg | ORAL_TABLET | Freq: Four times a day (QID) | ORAL | Status: DC | PRN
Start: 2013-03-31 — End: 2013-04-07

## 2013-03-31 MED ORDER — ATENOLOL 25 MG PO TABS
25.0000 mg | ORAL_TABLET | Freq: Every day | ORAL | Status: DC
  Filled 2013-03-31: qty 1

## 2013-03-31 MED ORDER — IMIPRAMINE HCL 25 MG PO TABS
75.0000 mg | ORAL_TABLET | Freq: Every day | ORAL | Status: DC
  Filled 2013-03-31: qty 1

## 2013-03-31 MED ORDER — LORAZEPAM BOLUS VIA INFUSION: 0.5000 mg | Freq: Every evening | INTRAVENOUS | Status: DC | PRN

## 2013-03-31 MED ORDER — SODIUM CHLORIDE 0.9 % IV SOLN
INTRAVENOUS | Status: DC
Start: 2013-03-31 — End: 2013-04-03
  Administered 2013-03-31 – 2013-04-02 (×2): via INTRAVENOUS

## 2013-03-31 MED ORDER — ZOLPIDEM TARTRATE 5 MG PO TABS
5.0000 mg | ORAL_TABLET | Freq: Every evening | ORAL | Status: DC | PRN
  Administered 2013-03-31 – 2013-04-01 (×2): 5 mg via ORAL
  Filled 2013-03-31 (×2): qty 1

## 2013-03-31 MED ORDER — ONDANSETRON HCL 4 MG/2ML IJ SOLN
4.0000 mg | Freq: Four times a day (QID) | INTRAMUSCULAR | Status: DC | PRN
  Administered 2013-04-02 – 2013-04-03 (×2): 4 mg via INTRAVENOUS
  Filled 2013-03-31 (×2): qty 2

## 2013-03-31 MED ORDER — HEPARIN SODIUM (PORCINE) 5000 UNIT/ML IJ SOLN
5000.0000 [IU] | Freq: Three times a day (TID) | INTRAMUSCULAR | Status: DC
  Administered 2013-03-31 – 2013-04-01 (×2): 5000 [IU] via SUBCUTANEOUS
  Filled 2013-03-31 (×5): qty 1

## 2013-03-31 MED ORDER — SODIUM CHLORIDE 0.9 % IV SOLN
INTRAVENOUS | Status: DC
  Administered 2013-03-31 – 2013-04-01 (×3): via INTRAVENOUS

## 2013-03-31 MED ORDER — LORAZEPAM 2 MG/ML IJ SOLN
0.5000 mg | Freq: Every evening | INTRAMUSCULAR | Status: DC | PRN
  Administered 2013-04-02 – 2013-04-05 (×4): 0.5 mg via INTRAVENOUS
  Filled 2013-03-31 (×4): qty 1

## 2013-03-31 NOTE — ED Provider Notes (Signed)
CSN: [id number]     Arrival date & time 03/31/13  1232 History   First MD Initiated Contact with Patient 03/31/13 1334     Chief Complaint  Patient presents with  . Emesis    HPI Pt was seen at 1340. Per pt and her family, c/o gradual onset and worsening of persistent N/V for the past several weeks, worse over the past 2 days. Pt states she is regurgitating after drinking fluids today. Pt's family states pt has recently be dx with right lung mass "that's pressing on her esophagus." Pt states she "has had my esophagus stretched that helped a little bit" and had a biopsy of the mass 2 days ago. Pt's family states Pulmonary Dr. Melvyn Novas told pt she "was dehydrated" 2 days ago during the bronchoscopy and told pt to come to the hospital to be admitted if she could no longer take fluids PO. Pt states she is here for admission today. Denies hematemesis, no hemoptysis, no diarrhea, no abd pain, no back pain, no CP/SOB, no fevers.    GI: Carlean Purl Pulmonary: [facility] Past Medical History  Diagnosis Date  . Anxiety   . Depression   . Osteoporosis   . Hyperlipidemia   . Hypertension   . GERD (gastroesophageal reflux disease)   . Dysphagia   . Lung mass 02/2013 CT scan    right side, compressing esophagus   Past Surgical History  Procedure Laterality Date  . Lumbar laminectomy    . Appendectomy    . Colonoscopy     Family History  Problem Relation Age of Onset  . Breast cancer      <50  . Ovarian cancer    . Coronary artery disease      <60  . Depression    . Arthritis    . Alcohol abuse    . Osteoporosis Mother   . Alzheimer's disease Mother   . Mental illness Mother     alzheimers  . Celiac disease Father   . Cancer Other     breast, ovarian   History  Substance Use Topics  . Smoking status: Never Smoker   . Smokeless tobacco: Never Used  . Alcohol Use: No    Review of Systems ROS: Statement: All systems negative except as marked or noted in the HPI; Constitutional: Negative for  fever and chills. ; ; Eyes: Negative for eye pain, redness and discharge. ; ; ENMT: Negative for ear pain, hoarseness, nasal congestion, sinus pressure and sore throat. ; ; Cardiovascular: Negative for chest pain, palpitations, diaphoresis, dyspnea and peripheral edema. ; ; Respiratory: Negative for cough, wheezing and stridor. ; ; Gastrointestinal: +N/V. Negative for diarrhea, abdominal pain, blood in stool, hematemesis, jaundice and rectal bleeding. . ; ; Genitourinary: Negative for dysuria, flank pain and hematuria. ; ; Musculoskeletal: Negative for back pain and neck pain. Negative for swelling and trauma.; ; Skin: Negative for pruritus, rash, abrasions, blisters, bruising and skin lesion.; ; Neuro: Negative for headache, lightheadedness and neck stiffness. Negative for weakness, altered level of consciousness , altered mental status, extremity weakness, paresthesias, involuntary movement, seizure and syncope.      Allergies  Cephalexin; Clindamycin; Codeine; Demerol; Hydrocodone-acetaminophen; and Penicillins  Home Medications   Current Outpatient Rx  Name  Route  Sig  Dispense  Refill  . ALPRAZolam (XANAX) 0.5 MG tablet   Oral   Take 0.5 mg by mouth at bedtime as needed for anxiety or sleep.         Marland Kitchen atenolol (  TENORMIN) 25 MG tablet   Oral   Take 1 tablet (25 mg total) by mouth daily.   90 tablet   1   . hydroxypropyl methylcellulose (ISOPTO TEARS) 2.5 % ophthalmic solution   Both Eyes   Place 1 drop into both eyes 2 (two) times daily as needed for dry eyes.         Marland Kitchen ibuprofen (ADVIL,MOTRIN) 200 MG tablet   Oral   Take 400 mg by mouth every 6 (six) hours as needed for headache or moderate pain.         Marland Kitchen imipramine (TOFRANIL) 25 MG tablet   Oral   Take 3 tablets (75 mg total) by mouth at bedtime.   270 tablet   1   . omeprazole (PRILOSEC OTC) 20 MG tablet   Oral   Take 20-40 mg by mouth daily as needed.          . sodium chloride (OCEAN) 0.65 % SOLN nasal  spray   Each Nare   Place 1 spray into both nostrils at bedtime as needed for congestion.         Marland Kitchen zolpidem (AMBIEN) 5 MG tablet   Oral   Take 1 tablet (5 mg total) by mouth at bedtime as needed for sleep.   30 tablet   1    BP 111/68  Pulse 102  Temp(Src) 98.3 F (36.8 C) (Oral)  Resp 16  SpO2 93% Physical Exam 1345: Physical examination:  Nursing notes reviewed; Vital signs and O2 SAT reviewed;  Constitutional: Well developed, Well nourished, In no acute distress; Head:  Normocephalic, atraumatic; Eyes: EOMI, PERRL, No scleral icterus; ENMT: Mouth and pharynx normal, Mucous membranes dry; Neck: Supple, Full range of motion, No lymphadenopathy; Cardiovascular: Regular rate and rhythm, No murmur, rub, or gallop; Respiratory: Breath sounds clear & equal bilaterally, No rales, rhonchi, wheezes.  Speaking full sentences with ease, Normal respiratory effort/excursion; Chest: Nontender, Movement normal; Abdomen: Soft, Nontender, Nondistended, Normal bowel sounds; Genitourinary: No CVA tenderness; Extremities: Pulses normal, No tenderness, No edema, No calf edema or asymmetry.; Neuro: AA&Ox3, Major CN grossly intact.  Speech clear. No gross focal motor or sensory deficits in extremities.; Skin: Color normal, Warm, Dry.   ED Course  Procedures   EKG Interpretation    Date/Time:  Sunday March 31 2013 13:53:01 EST Ventricular Rate:  86 PR Interval:  133 QRS Duration: 77 QT Interval:  389 QTC Calculation: 465 R Axis:   48 Text Interpretation:  Sinus rhythm Artifact Low voltage QRS Nonspecific T wave abnormality Abnormal ECG When compared with ECG of 02/11/2003 No significant change was found Confirmed by [facility]  MD, Nunzio Cory [phone]) on 03/31/2013 2:10:10 PM            MDM  MDM Reviewed: previous chart, nursing note and vitals Reviewed previous: labs, ECG, x-ray and CT scan Interpretation: labs, ECG and x-ray      Results for orders placed during the hospital encounter  of 03/31/13  URINALYSIS, ROUTINE W REFLEX MICROSCOPIC      Result Value Range   Color, Urine YELLOW  YELLOW   APPearance CLEAR  CLEAR   Specific Gravity, Urine 1.015  1.005 - 1.030   pH 6.5  5.0 - 8.0   Glucose, UA NEGATIVE  NEGATIVE mg/dL   Hgb urine dipstick NEGATIVE  NEGATIVE   Bilirubin Urine SMALL (*) NEGATIVE   Ketones, ur 40 (*) NEGATIVE mg/dL   Protein, ur NEGATIVE  NEGATIVE mg/dL   Urobilinogen, UA 0.2  0.0 - 1.0 mg/dL   Nitrite NEGATIVE  NEGATIVE   Leukocytes, UA NEGATIVE  NEGATIVE  BASIC METABOLIC PANEL      Result Value Range   Sodium 135 (*) 137 - 147 mEq/L   Potassium 3.8  3.7 - 5.3 mEq/L   Chloride 94 (*) 96 - 112 mEq/L   CO2 21  19 - 32 mEq/L   Glucose, Bld 91  70 - 99 mg/dL   BUN 11  6 - 23 mg/dL   Creatinine, Ser 0.60  0.50 - 1.10 mg/dL   Calcium 9.2  8.4 - 10.5 mg/dL   GFR calc non Af Amer 83 (*) >90 mL/min   GFR calc Af Amer >90  >90 mL/min  TROPONIN I      Result Value Range   Troponin I <0.30  <0.30 ng/mL  CBC WITH DIFFERENTIAL      Result Value Range   WBC 5.0  4.0 - 10.5 K/uL   RBC 4.38  3.87 - 5.11 MIL/uL   Hemoglobin 13.8  12.0 - 15.0 g/dL   HCT 40.3  36.0 - 46.0 %   MCV 92.0  78.0 - 100.0 fL   MCH 31.5  26.0 - 34.0 pg   MCHC 34.2  30.0 - 36.0 g/dL   RDW 12.6  11.5 - 15.5 %   Platelets 316  150 - 400 K/uL   Neutrophils Relative % 63  43 - 77 %   Neutro Abs 3.2  1.7 - 7.7 K/uL   Lymphocytes Relative 21  12 - 46 %   Lymphs Abs 1.0  0.7 - 4.0 K/uL   Monocytes Relative 14 (*) 3 - 12 %   Monocytes Absolute 0.7  0.1 - 1.0 K/uL   Eosinophils Relative 1  0 - 5 %   Eosinophils Absolute 0.1  0.0 - 0.7 K/uL   Basophils Relative 1  0 - 1 %   Basophils Absolute 0.0  0.0 - 0.1 K/uL  CG4 I-STAT (LACTIC ACID)      Result Value Range   Lactic Acid, Venous 2.28 (*) 0.5 - 2.2 mmol/L   Dg Chest 2 View 03/31/2013   CLINICAL DATA:  Shortness of breath  EXAM: CHEST  2 VIEW  COMPARISON:  03/13/2013  FINDINGS: Cardiac shadow is stable. A scoliosis is again  identified of the thoracolumbar spine. Increased density is again noted in the right lower lobe similar to that seen on the recent chest CT examination consistent with underlying mass and postobstructive change. A right-sided effusion is noted.  IMPRESSION: Persistent right lower lobe mass lesion with associated effusion and obstructive pneumonia.   Electronically Signed   By: Inez Catalina M.D.   On: 03/31/2013 14:27   Ct Chest W Contrast 03/25/2013   CLINICAL DATA:  Recent esophageal dilation, evaluate for periesophageal mass  EXAM: CT CHEST WITH CONTRAST  TECHNIQUE: Multidetector CT imaging of the chest was performed during intravenous contrast administration.  CONTRAST:  75 cc OMNIPAQUE IOHEXOL 300 MG/ML  SOLN intravenously  COMPARISON:  DG CHEST 2 VIEW dated 03/13/2013  FINDINGS: There is a large right hilar mass which measures 5 cm transversely x 3.4 cm AP x 3.4 cm longitudinally. There is a large right pleural effusion. There is a 1.3 cm diameter lymph node anterior to the right mainstem bronchus. There is poorly defined sub carinal lymphadenopathy which closely abuts the lower third of the esophagus. The cardiac chambers are normal in size. The caliber of the thoracic aorta is normal. The right  hilar mass does produce mass effect upon the right main descending pulmonary artery.  At lung window settings. There is minimal atelectasis of the inferior aspect of the right upper lobe adjacent to the right hilar mass. There is moderate atelectasis of the posterior medial aspect of the right middle lobe and more significant atelectasis in the superior segment of the right lower lobe. The left lung exhibits no atelectasis or infiltrate, but there are numerous subcentimeter pulmonary nodules present likely reflecting intrapulmonary metastatic disease.  Within the upper abdomen the observed portions of the liver are normal. The left adrenal gland exhibits a mass measuring 1.9 x 2.3 cm. The thoracic vertebral bodies are  preserved in height. There is mild disc space narrowing at several levels. The sternum appears intact. No acute rib lesion is demonstrated. The breast parenchyma and the axillary regions exhibit no acute abnormalities.  IMPRESSION: 1. There is a large right hilar mass worrisome for malignancy. Its medial border abuts the lower 1/3 of the thoracic esophagus. There is also a large right pleural effusion. There is atelectasis of portions of the right lung adjacent to the hilar mass and pleural effusion. 2. There are multiple subcentimeter nodules within the otherwise normal-appearing left lung worrisome for intra- pulmonary metastatic disease. 3. There are borderline to mildly enlarged mediastinal lymph nodes. 4. There is enlargement of the left adrenal gland worrisome for metastatic disease.   Electronically Signed   By: David  Martinique   On: 03/25/2013 15:27    1535:  Pt is orthostatic on VS and appears clinically dehydrated. Will continue IVF. Dx and testing d/w pt and family.  Questions answered.  Verb understanding, agreeable to admit. T/C to Pulm Dr. Chuck Hint, case discussed, including:  HPI, pertinent PM/SHx, VS/PE, dx testing, ED course and treatment:  He has viewed the CXR and does not feel it looks worse than her previous CXR, hold abx for now unless she develops a fever and elevated WBC count, he requests to admit to medicine service, Pulm service will consult this evening. T/C to [facility] Dr. Ernestina Patches, case discussed, including:  HPI, pertinent PM/SHx, VS/PE, dx testing, ED course and treatment:  Agreeable to admit, requests to write temporary orders, obtain medical bed to team 8.   Alfonzo Feller, DO 04/02/13 1118

## 2013-03-31 NOTE — ED Notes (Signed)
Report called to [facility] for transfer to [facility]

## 2013-03-31 NOTE — H&P (Addendum)
Hospitalist Admission History and Physical  Patient name: Beth Collier Medical record number: [id number] Date of birth: 17-Apr-1930 Age: 80 y.o. Gender: female  Primary Care Provider: Kathlene November, MD Pulmonology: Melvyn Novas  Gastroenterology: Carlean Purl    Chief Complaint: dysphagia, vomiting, dehydration   History of Present Illness:This is a 79 y.o. year old female with recent evaluation for dysphagia with noted hilar mass s/p bronchoscopy 03/28/13 with Dr. Melvyn Novas  presenting with dehydration, dysphagia. She was initially seen by gastroenterology via Dr. Carlean Purl for solid food dysphagia. She did have an esophageal dilation with endoscopy done. Patient was referred to pulmonology as it was considered for an extra esophageal mass. Patient is noted to have a hilar mass on CT. Bronchoscopy was done on February 6. Patient was given IV fluids and retirement setting of dehydration secondary to poor by mouth intake. Per the patient's family, they were instructed by Dr. Melvyn Novas that if she developed any more vomiting or inability to tolerate by mouth intake, to go to the hospital for further evaluation. Patient denies any fevers or chills. Does have some I'll left-sided chest pain associated with cough. Has been able to hold very few liquids and no solids. Emesis is nonbilious nonbloody. Denies any prior use of tobacco, ETOH in the past.   In the ER, patient noted to be afebrile satting 93-94% on room air. A chest x-ray was obtained showing a right-sided mass with possible postobstructive pneumonia and effusion. WBC WNL. Lactate mildly elevated @ 2.2. Case was discussed with pulmonology by the ED P., pulmonology does not feel that there is any significant changes from last chest x-ray/CT and treated for pneumonia is not clinically indicated currently. They will be formally consulting.    Patient Active Problem List   Diagnosis Date Noted  . Dehydration 03/31/2013  . Hilar mass-right 03/26/2013  . Loss of weight 03/19/2013   . Other dysphagia 03/13/2013  . Abnormal CXR 03/13/2013  . MALAISE AND FATIGUE 01/26/2009  . HYPERTENSION 06/14/2007  . LOC OSTEOARTHROS NOT SPEC PRIM/SEC UNSPEC SITE 06/14/2007  . HYPERLIPIDEMIA 09/25/2006  . LOSS, HEARING NOS 09/25/2006  . ANXIETY 06/23/2006  . DEPRESSION 06/23/2006  . OSTEOPOROSIS 06/23/2006  . TACHYCARDIA, HX OF 06/23/2006   Past Medical History: Past Medical History  Diagnosis Date  . Anxiety   . Depression   . Osteoporosis   . Hyperlipidemia   . Hypertension   . GERD (gastroesophageal reflux disease)   . Dysphagia   . Lung mass 02/2013 CT scan    right side, compressing esophagus    Past Surgical History: Past Surgical History  Procedure Laterality Date  . Lumbar laminectomy    . Appendectomy    . Colonoscopy      Social History: History   Social History  . Marital Status: Widowed    Spouse Name: N/A    Number of Children: 1  . Years of Education: N/A   Occupational History  . 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 Smoking status: Never Smoker   . Smokeless tobacco: Never Used  . Alcohol Use: No  . Drug Use: No  . Sexual Activity: Not Currently    Partners: Male   Other Topics Concern  . None   Social History Narrative   Widowed, retired. She has 1 son.   Drinks one caffeinated beverage daily.    Family History: Family History  Problem Relation Age of Onset  . Breast cancer      <  50  . Ovarian cancer    . Coronary artery disease      <60  . Depression    . Arthritis    . Alcohol abuse    . Osteoporosis Mother   . Alzheimer's disease Mother   . Mental illness Mother     alzheimers  . Celiac disease Father   . Cancer Other     breast, ovarian    Allergies: Allergies  Allergen Reactions  . Cephalexin Hives and Swelling    Dentist gave  . Clindamycin Hives and Swelling    Dentist gave  . Codeine Hives and Swelling  . Demerol [Meperidine] Nausea And Vomiting  .  Hydrocodone-Acetaminophen     Might have been give in ER  . Penicillins Hives and Swelling    Current Facility-Administered Medications  Medication Dose Route Frequency Provider Last Rate Last Dose  . 0.9 %  sodium chloride infusion   Intravenous Continuous Alfonzo Feller, DO 75 mL/hr at 03/31/13 1508    . 0.9 %  sodium chloride infusion   Intravenous Continuous Shanda Howells, MD      . atenolol (TENORMIN) tablet 25 mg  25 mg Oral Daily Shanda Howells, MD      . heparin injection 5,000 Units  5,000 Units Subcutaneous Q8H Shanda Howells, MD      . imipramine (TOFRANIL) tablet 75 mg  75 mg Oral QHS Shanda Howells, MD      . ondansetron [facility]) tablet 4 mg  4 mg Oral Q6H PRN Shanda Howells, MD       Or  . ondansetron [facility]) injection 4 mg  4 mg Intravenous Q6H PRN Shanda Howells, MD      . zolpidem (AMBIEN) tablet 5 mg  5 mg Oral QHS PRN Shanda Howells, MD       Current Outpatient Prescriptions  Medication Sig Dispense Refill  . ALPRAZolam (XANAX) 0.5 MG tablet Take 0.5 mg by mouth at bedtime as needed for anxiety or sleep.      Marland Kitchen atenolol (TENORMIN) 25 MG tablet Take 1 tablet (25 mg total) by mouth daily.  90 tablet  1  . hydroxypropyl methylcellulose (ISOPTO TEARS) 2.5 % ophthalmic solution Place 1 drop into both eyes 2 (two) times daily as needed for dry eyes.      Marland Kitchen ibuprofen (ADVIL,MOTRIN) 200 MG tablet Take 400 mg by mouth every 6 (six) hours as needed for headache or moderate pain.      Marland Kitchen imipramine (TOFRANIL) 25 MG tablet Take 3 tablets (75 mg total) by mouth at bedtime.  270 tablet  1  . omeprazole (PRILOSEC OTC) 20 MG tablet Take 20-40 mg by mouth daily as needed.       . sodium chloride (OCEAN) 0.65 % SOLN nasal spray Place 1 spray into both nostrils at bedtime as needed for congestion.      Marland Kitchen zolpidem (AMBIEN) 5 MG tablet Take 1 tablet (5 mg total) by mouth at bedtime as needed for sleep.  30 tablet  1   Review Of Systems: 12 point ROS negative except as noted above in  HPI.  Physical Exam: Filed Vitals:   03/31/13 1526  BP: 111/68  Pulse: 102  Temp:   Resp:     General: alert, cooperative and underweight  HEENT: PERRLA, extra ocular movement intact and mildly dry oral mucosa  Heart: S1, S2 normal, no murmur, rub or gallop, regular rate and rhythm Lungs: clear to auscultation, no wheezes or rales and unlabored breathing Abdomen:  abdomen is soft without significant tenderness, masses, organomegaly or guarding Extremities: extremities normal, atraumatic, no cyanosis or edema Skin:no rashes Neurology: normal without focal findings  Labs and Imaging: Lab Results  Component Value Date/Time   NA 135* 03/31/2013  1:45 PM   K 3.8 03/31/2013  1:45 PM   CL 94* 03/31/2013  1:45 PM   CO2 21 03/31/2013  1:45 PM   BUN 11 03/31/2013  1:45 PM   CREATININE 0.60 03/31/2013  1:45 PM   GLUCOSE 91 03/31/2013  1:45 PM   Lab Results  Component Value Date   WBC 5.0 03/31/2013   HGB 13.8 03/31/2013   HCT 40.3 03/31/2013   MCV 92.0 03/31/2013   PLT 316 03/31/2013   Urinalysis    Component Value Date/Time   COLORURINE YELLOW 03/31/2013 1510   APPEARANCEUR CLEAR 03/31/2013 1510   LABSPEC 1.015 03/31/2013 1510   PHURINE 6.5 03/31/2013 1510   GLUCOSEU NEGATIVE 03/31/2013 1510   HGBUR NEGATIVE 03/31/2013 1510   HGBUR negative 01/29/2010 0952   BILIRUBINUR SMALL* 03/31/2013 1510   BILIRUBINUR Neg 12/10/2012 1409   KETONESUR 40* 03/31/2013 1510   PROTEINUR NEGATIVE 03/31/2013 1510   UROBILINOGEN 0.2 03/31/2013 1510   UROBILINOGEN negative 12/10/2012 1409   NITRITE NEGATIVE 03/31/2013 1510   NITRITE Neg 12/10/2012 1409   LEUKOCYTESUR NEGATIVE 03/31/2013 1510   Lactate: 2.2     Dg Chest 2 View  03/31/2013   CLINICAL DATA:  Shortness of breath  EXAM: CHEST  2 VIEW  COMPARISON:  03/13/2013  FINDINGS: Cardiac shadow is stable. A scoliosis is again identified of the thoracolumbar spine. Increased density is again noted in the right lower lobe similar to that seen on the recent chest CT examination  consistent with underlying mass and postobstructive change. A right-sided effusion is noted.  IMPRESSION: Persistent right lower lobe mass lesion with associated effusion and obstructive pneumonia.   Electronically Signed   By: Inez Catalina M.D.   On: 03/31/2013 14:27     Assessment and Plan: Beth Collier is a 80 y.o. year old female presenting with decreased po intake, dysphagia, dehydration.   Dehydration/dysphagia: Will hydrate with NS. Set up for Barium swallow. Speech and language consult. Also consider GI consult. NPO. Suspect that specific treatment for dysphagia is dependent on findings for hilar mass. Continue to follow closely.   Hilar mass: Pulmonology aware of case. Diagnostic studies including cytology, AFB smear, pahology review still pending from recent bronchoscopy. Will hold off on abx treatment for CAP per pulmonary preliminary recs. Afebrile, non hypoxic, no leukocytosis. Start coverage if clinical case deteriorates. Pulm consult pending.   HTN:  Hemodynamically stable. BP LLN. Hold oral meds pending speech and possible GI eval.   FEN/GI: NPO. Gentle hydration.  Prophylaxis: sub q heparin  Disposition: pending further evaluation  Code Status:Full Code       Shanda Howells MD  Pager: [phone]

## 2013-03-31 NOTE — ED Notes (Signed)
Pt states that she has a known "blockage" in her esophagus.  States that she cannot eat much food without "spitting it back up".  Pt is being seen at [place] and had a biopsy.  States that she has vomited once but "cannot keep food down because she just spits it back up.

## 2013-03-31 NOTE — Progress Notes (Signed)
Called for report, ED nurse unavailable.  ED RN to call back.

## 2013-03-31 NOTE — ED Notes (Signed)
Dr Thurnell Garbe notified of abnormal lactic acid. dt

## 2013-04-01 ENCOUNTER — Inpatient Hospital Stay (HOSPITAL_COMMUNITY): Payer: Medicare Other

## 2013-04-01 ENCOUNTER — Encounter (HOSPITAL_COMMUNITY): Payer: Self-pay | Admitting: Internal Medicine

## 2013-04-01 DIAGNOSIS — C3490 Malignant neoplasm of unspecified part of unspecified bronchus or lung: Secondary | ICD-10-CM | POA: Diagnosis present

## 2013-04-01 DIAGNOSIS — E876 Hypokalemia: Secondary | ICD-10-CM | POA: Diagnosis present

## 2013-04-01 DIAGNOSIS — E43 Unspecified severe protein-calorie malnutrition: Secondary | ICD-10-CM | POA: Diagnosis present

## 2013-04-01 DIAGNOSIS — F411 Generalized anxiety disorder: Secondary | ICD-10-CM

## 2013-04-01 DIAGNOSIS — J90 Pleural effusion, not elsewhere classified: Secondary | ICD-10-CM | POA: Diagnosis present

## 2013-04-01 DIAGNOSIS — R110 Nausea: Secondary | ICD-10-CM | POA: Diagnosis present

## 2013-04-01 LAB — COMPREHENSIVE METABOLIC PANEL
ALBUMIN: 3.3 g/dL — AB (ref 3.5–5.2)
ALT: 9 U/L (ref 0–35)
AST: 18 U/L (ref 0–37)
Alkaline Phosphatase: 56 U/L (ref 39–117)
BUN: 12 mg/dL (ref 6–23)
CALCIUM: 8.5 mg/dL (ref 8.4–10.5)
CO2: 19 meq/L (ref 19–32)
CREATININE: 0.55 mg/dL (ref 0.50–1.10)
Chloride: 98 mEq/L (ref 96–112)
GFR calc Af Amer: >90 mL/min (ref >90)
GFR calc non Af Amer: 86 mL/min — ABNORMAL LOW (ref >90)
Glucose, Bld: 79 mg/dL (ref 70–99)
Potassium: 3.1 mEq/L — ABNORMAL LOW (ref 3.7–5.3)
SODIUM: 137 meq/L (ref 137–147)
Total Bilirubin: 0.5 mg/dL (ref 0.3–1.2)
Total Protein: 6.6 g/dL (ref 6.0–8.3)

## 2013-04-01 LAB — URINE CULTURE
Colony Count: NO GROWTH
Culture: NO GROWTH

## 2013-04-01 MED ORDER — ENOXAPARIN SODIUM 30 MG/0.3ML ~~LOC~~ SOLN
30.0000 mg | SUBCUTANEOUS | Status: DC
  Administered 2013-04-01 – 2013-04-03 (×3): 30 mg via SUBCUTANEOUS
  Filled 2013-04-01 (×3): qty 0.3

## 2013-04-01 MED ORDER — POTASSIUM CHLORIDE 10 MEQ/100ML IV SOLN
10.0000 meq | INTRAVENOUS | Status: AC
  Administered 2013-04-01 (×2): 10 meq via INTRAVENOUS
  Filled 2013-04-01 (×2): qty 100

## 2013-04-01 MED ORDER — KETOROLAC TROMETHAMINE 15 MG/ML IJ SOLN
15.0000 mg | Freq: Once | INTRAMUSCULAR | Status: AC
  Administered 2013-04-01: 15 mg via INTRAVENOUS
  Filled 2013-04-01: qty 1

## 2013-04-01 MED ORDER — POTASSIUM CHLORIDE 10 MEQ/100ML IV SOLN: 10.0000 meq | INTRAVENOUS | Status: DC

## 2013-04-01 MED ORDER — METOCLOPRAMIDE HCL 5 MG/ML IJ SOLN: 5.0000 mg | Freq: Three times a day (TID) | INTRAMUSCULAR | Status: DC | PRN

## 2013-04-01 NOTE — Consult Note (Signed)
PULMONARY  / CRITICAL CARE MEDICINE  Name: Beth Collier MRN: [id number] DOB: 08/10/1930 PCP Kathlene November, MD    ADMISSION DATE:  03/31/2013  LOS 1 days   CONSULTATION DATE:  04/01/2013   REFERRING MD :  Dr Leisa Lenz PRIMARY SERVICE: [place]:  Lung mass, Rt pleural effusion,. dysphagia   ANTIBIOTICS: Anti-infectives   None       HISTORY OF PRESENT ILLNESS:   80 year old ffemale nonsmoker without any passive smoking history presented to GI Dr Cristal Generous initially on 03/19/13 with one month hx of dysphagia (solid and liquid) and 10# weight loos. She subsequently underwent endoscopy gastrointestinal on 01/29 /2015 and was noted have a stenosis in the middle third of the esophagus. Etiology of stenosis was considered to be extrinsic. She underwent a notation that this has helped only somewhat with dysphagia. There for a CT scan of the chest was done on 03/25/2013 that showed centimeter right hilar mass with subcarinal mass causing compression extrinsically to the esophagus. There was also associated right pleural effusion. Then on 03/29/2013 she underwent video flexible fiberoptic bronchoscopy that showed severe swelling and cobblestoning of all the airways be on the right middle lobe orifice. She underwent right lower lobe bronchoalveolar lavage and right lower lobe endobronchial biopsy. Patient was discharged home after the procedure but she was admitted on 03/31/2013 with worsening dysphagia and some vomiting there is no fever. Chest x-ray 2.8/15 looks largely unchanged from 03/13/13 Pulmonary to care medicine is now being consulted for further care. Preliminary results from Dr. Cleora Fleet at pathology shows the mass to be of lung adenocarcinoma.   PAST MEDICAL HISTORY :  Past Medical History  Diagnosis Date  . Anxiety   . Depression   . Osteoporosis   . Hyperlipidemia   . Hypertension   . GERD (gastroesophageal reflux disease)   . Dysphagia   . Lung mass 02/2013  CT scan    right side, compressing esophagus     Family History  Problem Relation Age of Onset  . Breast cancer      <50  . Ovarian cancer    . Coronary artery disease      <60  . Depression    . Arthritis    . Alcohol abuse    . Osteoporosis Mother   . Alzheimer's disease Mother   . Mental illness Mother     alzheimers  . Celiac disease Father   . Cancer Other     breast, ovarian     History   Social History  . Marital Status: Widowed    Spouse Name: N/A    Number of Children: 1  . Years of Education: N/A   Occupational History  . 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 Smoking status: Never Smoker   . Smokeless tobacco: Never Used  . Alcohol Use: No  . Drug Use: No  . Sexual Activity: Not Currently    Partners: Male   Other Topics Concern  . Not on file   Social History Narrative   Widowed, retired. She has 1 son.   Drinks one caffeinated beverage daily.     Allergies  Allergen Reactions  . Cephalexin Hives and Swelling    Dentist gave  . Clindamycin Hives and Swelling    Dentist gave  . Codeine Hives and Swelling  . Demerol [Meperidine] Nausea And Vomiting  . Hydrocodone-Acetaminophen  Might have been give in ER  . Penicillins Hives and Swelling      (Not in an outpatient encounter)     REVIEW OF SYSTEMS:  Positives listed in HPI. Rest 11 point ROS negative  SUBJECTIVE:   VITAL SIGNS: Filed Vitals:   03/31/13 1526 03/31/13 1830 03/31/13 2030 04/01/13 0531  BP: 111/68 134/95 127/96 140/79  Pulse: 102 52 90 101  Temp:  97.9 F (36.6 C) 97.9 F (36.6 C) 98 F (36.7 C)  TempSrc:  Oral Oral Oral  Resp:  _0 Height:  4' 11.6" (1.514 m)    Weight:  42.003 kg (92 lb 9.6 oz)  41.958 kg (92 lb 8 oz)  SpO2:  98% 97% 100%     VENTILATOR SETTINGS: Vent Mode:  [-] PRVC FiO2 (%):  [30 %] 30 % Set Rate:  [28 bmp] 28 bmp Vt Set:  [420 mL] 420 mL PEEP:  [5 cmH20] 5 cmH20 Plateau  Pressure:  [4 XMD47-09 cmH20] 20 cmH20 INTAKE / OUTPUT: I/O last 3 completed shifts: In: -  Out: 400 [Urine:400]     PHYSICAL EXAMINATION: General:  Thin, pleasant female Neuro:  AxOx3. Good memory. Slow speech. GCC 15. Moves all 4s HEENT:  Neck supple. No neck nodes Cardiovascular:  S1S@+. No murmurs Lungs:  Diminished air entry with percussion stony duillness on right base. No crackles Abdomen:  Soft, non tender, no organomegaly Musculoskeletal:  No cyanosis, No clubbing. No edema Skin:  Intact  LABS: PULMONARY No results found for this basename: PHART, PCO2, PCO2ART, PO2, PO2ART, HCO3, TCO2, O2SAT,  in the last 168 hours  CBC  Recent Labs Lab 03/31/13 1555  HGB 13.8  HCT 40.3  WBC 5.0  PLT 316    COAGULATION No results found for this basename: INR,  in the last 168 hours  CARDIAC   Recent Labs Lab 03/31/13 1345  TROPONINI <0.30   No results found for this basename: PROBNP,  in the last 168 hours   CHEMISTRY  Recent Labs Lab 03/31/13 1345 04/01/13 0324  NA 135* 137  K 3.8 3.1*  CL 94* 98  CO2 21 19  GLUCOSE 91 79  BUN 11 12  CREATININE 0.60 0.55  CALCIUM 9.2 8.5   Estimated Creatinine Clearance: 36.6 ml/min (by C-G formula based on Cr of 0.55).   LIVER  Recent Labs Lab 04/01/13 0324  AST 18  ALT 9  ALKPHOS 56  BILITOT 0.5  PROT 6.6  ALBUMIN 3.3*     INFECTIOUS  Recent Labs Lab 03/31/13 1421  LATICACIDVEN 2.28*     ENDOCRINE CBG (last 3)  No results found for this basename: GLUCAP,  in the last 72 hours       IMAGING x48h  Dg Chest 2 View  03/31/2013   CLINICAL DATA:  Shortness of breath  EXAM: CHEST  2 VIEW  COMPARISON:  03/13/2013  FINDINGS: Cardiac shadow is stable. A scoliosis is again identified of the thoracolumbar spine. Increased density is again noted in the right lower lobe similar to that seen on the recent chest CT examination consistent with underlying mass and postobstructive change. A right-sided  effusion is noted.  IMPRESSION: Persistent right lower lobe mass lesion with associated effusion and obstructive pneumonia.   Electronically Signed   By: Inez Catalina M.D.   On: 03/31/2013 14:27   IMPRESSION: CT chest 03/25/13 1. There is a large right hilar mass worrisome for malignancy. Its  medial border abuts the lower 1/3 of  the thoracic esophagus. There  is also a large right pleural effusion. There is atelectasis of  portions of the right lung adjacent to the hilar mass and pleural  effusion.  2. There are multiple subcentimeter nodules within the otherwise  normal-appearing left lung worrisome for intra- pulmonary metastatic  disease.  3. There are borderline to mildly enlarged mediastinal lymph nodes.  4. There is enlargement of the left adrenal gland worrisome for  metastatic disease.  Electronically Signed  By: David Martinique  On: 03/25/2013 15:27      ASSESSMENT / PLAN:  PULMONARY A:AdenoCa lung (prelim)- suspect STage 4 based on right pleural effusion and left adrenal gland enlargement.    P:   Broke prelim news to patient - she was interested in knowing diagnosis without presence of son and stated she will rely to son She is not interested in aggressive chemo: Quality of life is main issue for her.  -  Indicated wait for ALK/EGFR mutations which if are positive cand give her right balance of chemo risk and benefit but also stated oncology Dr Julien Nordmann should opine Onc Consult - Dr Julien Nordmann called Right thora depending on course and symptoms   CARDIOVASCULAR A: Nil acute P:  mmonitor  RENAL A:  Low K P:   Replete IV Check mag and phos  GASTROINTESTINAL A:  Dysphagia due to Lung mass extrinsic compression. Assoicated with nausea P:   REglan 66m IV q8h prn SLP eval Not sure if bariuim swallow will reveal anything we do not know - Dr DCharlies Silversto check with GI/SLP and cancel if appropriate  HEMATOLOGIC A:  hihg risk for DVT/PE P:  Change heparin to  lovenox  INFECTIOUS A:  No evidnce of infection P:   Monitor off abx  ENDOCRINE A:  Nil acute   P:   Per [facility]  NEUROLOGIC A:  Intact P:   Monitior  TODAY'S SUMMARY: d/w Dr DCharlies Silvers Await formal path report. She will call Dr MJulien Nordmann Rest per above      Dr. MBrand Males M.D., FMease Dunedin HospitalC.P Pulmonary and Critical Care Medicine Staff Physician CWestdalePulmonary and Critical Care Pager: [phone] If no answer or between  15:00h - 7:00h: call [phone]  04/01/2013 9:37 AM

## 2013-04-01 NOTE — Progress Notes (Signed)
INITIAL NUTRITION ASSESSMENT  DOCUMENTATION CODES Per approved criteria  -Severe malnutrition in the context of chronic illness -Underweight  Pt meets criteria for severe MALNUTRITION in the context of chronic illness as evidenced by 16% weight loss in one month period, PO intake < 75% est nutrition needs for > one month, moderate wasting/fat loss in upper extremities and patellar/calf regions   INTERVENTION: -Diet advancement per MD -Will continue to monitor  NUTRITION DIAGNOSIS: Inadequate oral intake related to difficulty swallowing as evidenced by PO intake <75% for > 3 weeks  Goal: Pt to meet >/= 90% of their estimated nutrition needs    Monitor:  Diet order, swallow profile, labs, weights, total protein/energy intake  Reason for Assessment: MST/Underweight BMI  80 y.o. female  Admitting Dx: <principal problem not specified>  ASSESSMENT: This is a 80 y.o. year old female with recent evaluation for dysphagia with noted hilar mass s/p bronchoscopy 03/28/13 with Dr. Melvyn Novas presenting with dehydration, dysphagia. She was initially seen by gastroenterology via Dr. Carlean Purl for solid food dysphagia. She did have an esophageal dilation with endoscopy done. Patient was referred to pulmonology as it was considered for an extra esophageal mass. Patient is noted to have a hilar mass on CT. Bronchoscopy was done on February 6. Patient was given IV fluids and retirement setting of dehydration secondary to poor by mouth intake. Per the patient's family, they were instructed by Dr. Melvyn Novas that if she developed any more vomiting or inability to tolerate by mouth intake, to go to the hospital for further evaluation. Patient denies any fevers or chills. Does have some I'll left-sided chest pain associated with cough. Has been able to hold very few liquids and no solids. Emesis is nonbilious nonbloody. Denies any prior use of tobacco, ETOH in the past.   -Pt reported poor PO intake for past 3 weeks d/t  difficulty swallowing.  -Has tried soft foods, such as oatmeal and applesauce but is unable to keep it down -Tolerates thickened liquids. Pt's family would provide her with fruit smoothies that pt was able to tolerate until Sunday (2/08) d/t dysphagia and nausea -Pt has tried Ensure shakes, but has difficulty with lactose products and d/c supplement -Usual weight around 110 lbs. Has experienced a 18 lbs weight loss over 3 week period -MD noted pt w/dysphagia likely r/t lung mass. Has had esophageal dilation.SLP and GI consult pending -Pt was open to trying additional supplements pending SLP recommendations -Nutrition Focused Physical Exam:  Subcutaneous Fat:  Orbital Region: WNL Upper Arm Region: moderate Thoracic and Lumbar Region: moderate  Muscle:  Temple Region: moderate Clavicle Bone Region: moderate Clavicle and Acromion Bone Region: moderate Scapular Bone Region: WNL Dorsal Hand: WNL Patellar Region: moderate Anterior Thigh Region: WNL Posterior Calf Region: moderate  Edema: N/A    Height: Ht Readings from Last 1 Encounters:  03/31/13 4' 11.6" (1.514 m)    Weight: Wt Readings from Last 1 Encounters:  04/01/13 92 lb 8 oz (41.958 kg)    Ideal Body Weight: 97.5 lbs  % Ideal Body Weight: 94%  Wt Readings from Last 10 Encounters:  04/01/13 92 lb 8 oz (41.958 kg)  03/27/13 96 lb (43.545 kg)  03/21/13 98 lb (44.453 kg)  03/19/13 98 lb 4 oz (44.566 kg)  03/19/13 98 lb (44.453 kg)  03/13/13 100 lb (45.36 kg)  02/01/13 105 lb 6.4 oz (47.809 kg)  12/10/12 109 lb (49.442 kg)  07/31/12 107 lb 6.4 oz (48.716 kg)  02/01/12 108 lb 6.4 oz (49.17 kg)  Usual Body Weight: 110 lbs  % Usual Body Weight: 84%  BMI:  Body mass index is 18.3 kg/(m^2). Underweight  Estimated Nutritional Needs: Kcal: 1250-1450 Protein: 55-65 gram Fluid: >/=1400 ml/daily  Skin: WDl  Diet Order: NPO  EDUCATION NEEDS: -No education needs identified at this time   Intake/Output  Summary (Last 24 hours) at 04/01/13 1148 Last data filed at 04/01/13 1036  Gross per 24 hour  Intake      0 ml  Output    850 ml  Net   -850 ml    Last BM: pta   Labs:   Recent Labs Lab 03/31/13 1345 04/01/13 0324  NA 135* 137  K 3.8 3.1*  CL 94* 98  CO2 21 19  BUN 11 12  CREATININE 0.60 0.55  CALCIUM 9.2 8.5  GLUCOSE 91 79    CBG (last 3)  No results found for this basename: GLUCAP,  in the last 72 hours  Scheduled Meds: . enoxaparin (LOVENOX) injection  30 mg Subcutaneous Q24H  . potassium chloride  10 mEq Intravenous Q1 Hr x 2    Continuous Infusions: . sodium chloride Stopped (03/31/13 1854)    Past Medical History  Diagnosis Date  . Anxiety   . Depression   . Osteoporosis   . Hyperlipidemia   . Hypertension   . GERD (gastroesophageal reflux disease)   . Dysphagia   . Lung mass 02/2013 CT scan    right side, compressing esophagus    Past Surgical History  Procedure Laterality Date  . Lumbar laminectomy    . Appendectomy    . Colonoscopy    . Video bronchoscopy Bilateral 03/29/2013    Procedure: VIDEO BRONCHOSCOPY WITHOUT FLUORO;  Surgeon: Tanda Rockers, MD;  Location: [facility];  Service: Cardiopulmonary;  Laterality: Bilateral;     [place] LDN Clinical Dietitian [phone]

## 2013-04-01 NOTE — Progress Notes (Addendum)
[facility] PROGRESS NOTE  Beth Collier JSE:[id number] DOB: [birth year] DOA: 03/31/2013 PCP: Kathlene November, MD  Brief narrative: 80 y.o. year old female with recent evaluation of right hilar mass, status post bronchoscopy 03/28/2013 (done by Dr. Melvyn Novas) which now came back positive for adenocarcinoma, also was evaluated by gastroenterology for dysphagia and has had an esophageal dilation with endoscopy, 03/21/2013.. This was determined to be secondary to extra esophageal mass. Pt presented to [facility] ED 03/31/2013 with worsening dysphagia and vomiting. CXR done  on the admission was unchanged since the recent studies, showed persistent right lower lobe mass with associated effusion and obstructive pneumonia. Pulmonary assisting the management. I have informed cancer Center, lung cancer coordinator to establish followup with the patient in regards to future treatments.  Assessment/Plan:  Active Problems:   Dysphasia, dehydration - Likely secondary to extrinsic compression of lung mass - Will touch base with GI if they think it's reasonable to do swallowing evaluation. - N.p.o. for now, continue IV fluids and antiemetics   Adenocarcinoma of lung, stage 4 - Appreciate pulmonary following. I called cancer Center, lung cancer coordinator to establish followup for this patient in regards to future chemotherapy or radiation treatments   Pleural effusion - On the right side, thoracentesis per pulmonary if respiratory status worsens   Hypokalemia - Secondary to GI losses - Repleted   Protein-calorie malnutrition, severe - Secondary to history of malignancy - N.p.o. due to dysphagia   Code Status: full code  Family Communication: no family at the bedside  Disposition Plan: home when stable   Leisa Lenz, MD  [facility] Pager [phone]  If 7PM-7AM, please contact night-coverage [email or web address] Password [email or web address] 04/01/2013, 12:01 PM   LOS: 1 day   Consultants:  Pulmonary   Gastroenterology    Informed cancer center (lung cancer coordinator) they will schedule and appt with Dr. Julien Nordmann for further management   Procedures:  Bronchoscopy 03/28/2013   Antibiotics:  None   HPI/Subjective: No acute overnight events.   Objective: Filed Vitals:   03/31/13 1526 03/31/13 1830 03/31/13 2030 04/01/13 0531  BP: 111/68 134/95 127/96 140/79  Pulse: 102 52 90 101  Temp:  97.9 F (36.6 C) 97.9 F (36.6 C) 98 F (36.7 C)  TempSrc:  Oral Oral Oral  Resp:  14 16 16   Height:  4' 11.6" (1.514 m)    Weight:  42.003 kg (92 lb 9.6 oz)  41.958 kg (92 lb 8 oz)  SpO2:  98% 97% 100%    Intake/Output Summary (Last 24 hours) at 04/01/13 1201 Last data filed at 04/01/13 1036  Gross per 24 hour  Intake      0 ml  Output   1050 ml  Net  -1050 ml    Exam:   General:  Pt is alert, follows commands appropriately, not in acute distress  Cardiovascular: Regular rate and rhythm, S1/S2 appreciated   Respiratory: Clear to auscultation bilaterally, no wheezing, no crackles, no rhonchi  Abdomen: Soft, non tender, non distended, bowel sounds present, no guarding  Extremities: No edema, pulses DP and PT palpable bilaterally  Neuro: Grossly nonfocal  Data Reviewed: Basic Metabolic Panel:  Recent Labs Lab 03/31/13 1345 04/01/13 0324  NA 135* 137  K 3.8 3.1*  CL 94* 98  CO2 21 19  GLUCOSE 91 79  BUN 11 12  CREATININE 0.60 0.55  CALCIUM 9.2 8.5   Liver Function Tests:  Recent Labs Lab 04/01/13 0324  AST 18  ALT 9  ALKPHOS 56  BILITOT  0.5  PROT 6.6  ALBUMIN 3.3*   No results found for this basename: LIPASE, AMYLASE,  in the last 168 hours No results found for this basename: AMMONIA,  in the last 168 hours CBC:  Recent Labs Lab 03/31/13 1555  WBC 5.0  NEUTROABS 3.2  HGB 13.8  HCT 40.3  MCV 92.0  PLT 316   Cardiac Enzymes:  Recent Labs Lab 03/31/13 1345  TROPONINI <0.30   BNP: No components found with this basename: POCBNP,  CBG: No results found for  this basename: GLUCAP,  in the last 168 hours  AFB CULTURE WITH SMEAR     Status: None   Collection Time    03/29/13  8:33 AM      Result Value Range Status   Specimen Description BAL  Final   Value: NO ACID FAST BACILLI SEEN   Value: CULTURE WILL BE EXAMINED FOR 6 WEEKS BEFORE ISSUING A FINAL REPORT     Performed at [facility]   Report Status PENDING   Incomplete  FUNGUS CULTURE W SMEAR     Status: None   Collection Time    03/29/13  8:33 AM      Result Value Range Status   Specimen Description BAL   Final   Value: NO YEAST OR FUNGAL ELEMENTS SEEN   Report Status PENDING   Incomplete     Studies: Dg Chest 2 View 03/31/2013    IMPRESSION: Persistent right lower lobe mass lesion with associated effusion and obstructive pneumonia.      Scheduled Meds: . enoxaparin (LOVENOX) injection  30 mg Subcutaneous Q24H  . potassium chloride  10 mEq Intravenous Q1 Hr x 2   Continuous Infusions: . sodium chloride Stopped (03/31/13 1854)

## 2013-04-01 NOTE — Evaluation (Addendum)
SLP Cancellation Note  Patient Details Name: Beth Collier MRN: [id number] DOB: October 24, 1930   Cancelled treatment:       Reason Eval/Treat Not Completed:  (pt with lung mass compressing on esophagus per chart review, GI referral pending, phoned md who agreed for slp to dc slp and reorder if indicated)   Luanna Salk, [facility] [place] [phone]

## 2013-04-02 ENCOUNTER — Encounter: Payer: Self-pay | Admitting: Internal Medicine

## 2013-04-02 ENCOUNTER — Encounter: Payer: Self-pay | Admitting: *Deleted

## 2013-04-02 ENCOUNTER — Inpatient Hospital Stay (HOSPITAL_COMMUNITY): Payer: Medicare Other

## 2013-04-02 ENCOUNTER — Telehealth: Payer: Self-pay | Admitting: Internal Medicine

## 2013-04-02 DIAGNOSIS — R1310 Dysphagia, unspecified: Secondary | ICD-10-CM

## 2013-04-02 DIAGNOSIS — C7970 Secondary malignant neoplasm of unspecified adrenal gland: Secondary | ICD-10-CM

## 2013-04-02 DIAGNOSIS — K222 Esophageal obstruction: Secondary | ICD-10-CM

## 2013-04-02 DIAGNOSIS — R1319 Other dysphagia: Secondary | ICD-10-CM

## 2013-04-02 DIAGNOSIS — R599 Enlarged lymph nodes, unspecified: Secondary | ICD-10-CM

## 2013-04-02 LAB — COMPREHENSIVE METABOLIC PANEL
ALK PHOS: 58 U/L (ref 39–117)
ALT: 12 U/L (ref 0–35)
AST: 23 U/L (ref 0–37)
Albumin: 3.5 g/dL (ref 3.5–5.2)
BUN: 7 mg/dL (ref 6–23)
CHLORIDE: 99 meq/L (ref 96–112)
CO2: 13 mEq/L — ABNORMAL LOW (ref 19–32)
Calcium: 8.6 mg/dL (ref 8.4–10.5)
Creatinine, Ser: 0.44 mg/dL — ABNORMAL LOW (ref 0.50–1.10)
Glucose, Bld: 81 mg/dL (ref 70–99)
Potassium: 3.9 mEq/L (ref 3.7–5.3)
Sodium: 133 mEq/L — ABNORMAL LOW (ref 137–147)
Total Bilirubin: 0.6 mg/dL (ref 0.3–1.2)
Total Protein: 6.8 g/dL (ref 6.0–8.3)

## 2013-04-02 LAB — MAGNESIUM: MAGNESIUM: 1.9 mg/dL (ref 1.5–2.5)

## 2013-04-02 LAB — PHOSPHORUS: Phosphorus: 2.4 mg/dL (ref 2.3–4.6)

## 2013-04-02 MED ORDER — KETOROLAC TROMETHAMINE 15 MG/ML IJ SOLN
15.0000 mg | Freq: Four times a day (QID) | INTRAMUSCULAR | Status: AC | PRN
  Administered 2013-04-02 – 2013-04-06 (×6): 15 mg via INTRAVENOUS
  Filled 2013-04-02 (×6): qty 1

## 2013-04-02 MED ORDER — GADOBENATE DIMEGLUMINE 529 MG/ML IV SOLN
8.0000 mL | Freq: Once | INTRAVENOUS | Status: AC | PRN
  Administered 2013-04-02: 8 mL via INTRAVENOUS

## 2013-04-02 MED ORDER — HYDRALAZINE HCL 20 MG/ML IJ SOLN
10.0000 mg | Freq: Three times a day (TID) | INTRAMUSCULAR | Status: DC
  Administered 2013-04-02 – 2013-04-07 (×13): 10 mg via INTRAVENOUS
  Filled 2013-04-02 (×17): qty 0.5

## 2013-04-02 NOTE — Care Management Note (Addendum)
    Page 1 of 1   04/02/2013     4:13:41 PM   CARE MANAGEMENT NOTE 04/02/2013  Patient:  Beth Collier   Account Number:  [id number]  Date Initiated:  04/02/2013  Documentation initiated by:  Dessa Phi  Subjective/Objective Assessment:   80 Y/O F ADMITTED W/DEHYDRATION.     Action/Plan:   FROM HOME ALONE.HARD OF HEARING.   Anticipated DC Date:  04/02/2013   Anticipated DC Plan:  [place]  CM consult      Choice offered to / List presented to:             Status of service:  In process, will continue to follow Medicare Important Message given?   (If response is "NO", the following Medicare IM given date fields will be blank) Date Medicare IM given:   Date Additional Medicare IM given:    Discharge Disposition:    Per UR Regulation:  Reviewed for med. necessity/level of care/duration of stay  If discussed at [place] of Stay Meetings, dates discussed:    Comments:  04/02/13 Beth Rauch RN,BSN NCM [phone] R LUNG MASS-ONCOLOGY CONS.WOULD RECOMMEND PT CONS WHEN MEDICALLY APPROPRIATE.

## 2013-04-02 NOTE — Telephone Encounter (Signed)
Discussed with daughter > w/u in progress

## 2013-04-02 NOTE — Progress Notes (Signed)
UR completed. Patient has been changed to inpatient- requiring IVF @ 75cc/hr NPO

## 2013-04-02 NOTE — Telephone Encounter (Signed)
Spoke with pt's daughter in [employer]. MR saw pt yesterday in the hospital. He gave her the results of her bronch. Pt knows that she has cancer but her son and daughter in law have questions. What stage is the cancer? What is her prognosis?  The stage of cancer is in MR's consult note but I didn't feel comfortable to give this info over the phone.  They would like for MW to call them and answer their questions if possible.  MW - please advise. Thanks.

## 2013-04-02 NOTE — Progress Notes (Signed)
Faxed foundation request to [facility] Pathology Dept

## 2013-04-02 NOTE — Progress Notes (Addendum)
[facility] PROGRESS NOTE  Beth Collier XNA:[id number] DOB: 1930/12/21 DOA: 03/31/2013 PCP: Kathlene November, MD  Brief narrative: 80 y.o.very sweet, pleasent female with recent finding of right hilar mass, status post bronchoscopy 03/28/2013 (done by Dr. Melvyn Novas) which is now confirmed to be   Adenocarcinoma. Pt was also evaluated by gastroenterology (by Dr. Carlean Purl)  for dysphagia and has had an esophageal dilation with endoscopy, 03/21/2013. This dysphagia was determined to be secondary to this extra esophageal mass/right hilar mass/ adenocarcinoma. Pt presented to [facility] ED 03/31/2013 with worsening dysphagia and vomiting. CXR done on the admission was unchanged since the recent studies, showed persistent right lower lobe mass with associated effusion and obstructive pneumonia. Pt was seen by pulmonary but no further recommendations were made as pt already had bronchoscopy. If her status worsens then they can be consulted for thoracentesis. I spoke with GI as well and they believe dysphagia is due to extra esophageal mass compressing on esophagus. Oncology is involved in management as well. Will follow up on their recommendations, possible RT while in hospital.  Assessment/Plan:   Principal Problem:  Dysphasia, dehydration  - Likely secondary to extrinsic compression onto esophagus from the lung mass  - appreciate oncology consult and recommendations; possible need for RT while in hospital - Currently NPO; has IV fluids, NS @ 75 cc/hr Active problems: Adenocarcinoma of lung, stage 4  - Appreciate oncology seen the patient and appreciate their recommendations. As mentioned above, patient may have radiation therapy while in hospital Pleural effusion  - On the right side, thoracentesis per pulmonary if respiratory status worsens  Hypokalemia  - Secondary to GI losses, nausea - Repleted  Hypertension - will use hydralazine 10 mg every 8 hours IV; SBP in 160's range Protein-calorie malnutrition, severe  -  Secondary to history of malignancy  - N.p.o. due to dysphagia  Code Status: full code  Family Communication: no family at the bedside  Disposition Plan: home  when stable   Consultants:  Pulmonary  Gastroenterology (Dr. Carlean Purl) - phone call only Oncology (Dr. Julien Nordmann)  Procedures:  Bronchoscopy 03/28/2013  Antibiotics:  None    Code Status: full code  Family Communication: no family at the bedside Disposition Plan: home when stable   Leisa Lenz, MD  [facility] Pager [phone]  If 7PM-7AM, please contact night-coverage [email or web address] Password [email or web address] 04/02/2013, 1:46 PM   LOS: 2 days    HPI/Subjective: No acute overnight events. She seems to be in good spirits this am. She hopes for the best but understands her diagnosis and is fine with it. She says "God is taking care of me". She is very sweat and lovely lady.   Objective: Filed Vitals:   04/01/13 0531 04/01/13 1342 04/01/13 2154 04/02/13 0530  BP: 140/79 147/80 152/88 150/88  Pulse: 101 104 105 107  Temp: 98 F (36.7 C) 97.7 F (36.5 C) 97.9 F (36.6 C) 98.1 F (36.7 C)  TempSrc: Oral Oral Oral Oral  Resp: 16 16 20 20   Height:      Weight: 41.958 kg (92 lb 8 oz)   41.731 kg (92 lb)  SpO2: 100% 97% 100% 98%    Intake/Output Summary (Last 24 hours) at 04/02/13 1346 Last data filed at 04/02/13 1155  Gross per 24 hour  Intake  282.5 ml  Output   2375 ml  Net -2092.5 ml    Exam:   General:  Pt is alert, follows commands appropriately, not in acute distress  Cardiovascular: Regular rate and rhythm, S1/S2,  no murmurs  Respiratory: Clear to auscultation bilaterally, no wheezing  Abdomen: Soft, non tender, non distended, bowel sounds present, no guarding  Extremities: Pulses DP and PT palpable bilaterally  Neuro: Grossly nonfocal  Data Reviewed: Basic Metabolic Panel:  Recent Labs Lab 03/31/13 1345 04/01/13 0324 04/02/13 0350  NA 135* 137 133*  K 3.8 3.1* 3.9  CL 94* 98 99  CO2 21 19  13*  GLUCOSE 91 79 81  BUN 11 12 7   CREATININE 0.60 0.55 0.44*  CALCIUM 9.2 8.5 8.6  MG  --   --  1.9  PHOS  --   --  2.4   Liver Function Tests:  Recent Labs Lab 04/01/13 0324 04/02/13 0350  AST 18 23  ALT 9 12  ALKPHOS 56 58  BILITOT 0.5 0.6  PROT 6.6 6.8  ALBUMIN 3.3* 3.5   No results found for this basename: LIPASE, AMYLASE,  in the last 168 hours No results found for this basename: AMMONIA,  in the last 168 hours CBC:  Recent Labs Lab 03/31/13 1555  WBC 5.0  NEUTROABS 3.2  HGB 13.8  HCT 40.3  MCV 92.0  PLT 316   Cardiac Enzymes:  Recent Labs Lab 03/31/13 1345  TROPONINI <0.30   BNP: No components found with this basename: POCBNP,  CBG: No results found for this basename: GLUCAP,  in the last 168 hours  AFB CULTURE WITH SMEAR     Status: None   Collection Time    03/29/13  8:33 AM      Result Value Range Status   Specimen Description BRONCHIAL ALVEOLAR LAVAGE   Final   Value: NO ACID FAST BACILLI SEEN   Report Status PENDING   Incomplete  FUNGUS CULTURE W SMEAR     Status: None   Collection Time    03/29/13  8:33 AM      Result Value Range Status   Specimen Description BRONCHIAL ALVEOLAR LAVAGE   Final   Value: CULTURE IN PROGRESS FOR FOUR WEEKS     Performed at [facility]   Report Status PENDING   Incomplete  URINE CULTURE     Status: None   Collection Time    03/31/13  3:10 PM      Result Value Range Status   Specimen Description URINE, CATHETERIZED   Final   Value: NO GROWTH     Performed at [facility]   Report Status 04/01/2013 FINAL   Final     Studies: Dg Chest 2 View 03/31/2013    IMPRESSION: Persistent right lower lobe mass lesion with associated effusion and obstructive pneumonia.      Scheduled Meds: . enoxaparin (LOVENOX) injection  30 mg Subcutaneous Q24H   Continuous Infusions: . sodium chloride 75 mL/hr at 04/02/13 1104

## 2013-04-02 NOTE — Consult Note (Signed)
Beth  Telephone:([phone]) [place]                                MR#: [id number]  DOB: Oct 21, 1930                       CSN#: [id number]  Referring MD: Dr. Sheliah Plane [facility]   Primary MD: [place]  Reason for Consult: Lung Cancer   QDI:YMEB V Beth Collier is a 80 y.o. female never smoker asked to see for evaluation of  Lung Cancer.   In review, she was referred in January of 2015 to a GI evaluation after experiencing progressive despite year with solids and liquids for about one month, along with a 10 pound weight loss.A CXR on 1/21 had shown possible RML infiltrates with small R pleural effusion.  Upper endoscopy on 03/21/2013 Dr. Carlean Purl showed stenosis in the mid third of the esophagus. Etiology was considered to be extrinsic. She did have esophageal dilatation in an attempt to improve her symptoms. A CT of the chest with contrast on 03/25/13 revealed a large right hilar mass measuring  5 cm  x 3.4 cm x 3.4 cm suspicious for malignancy. Other findings included a large right pleural effusion, a 1.3 cm   lymph node anterior to the right mainstem bronchus. There is poorly defined sub carinal lymphadenopathy which closely abuts the lower third of the esophagus. The right hilar mass does produce mass effect upon the right main descending pulmonary artery.Numerous subcentimeter pulmonary nodules were present worrisome for metastatic disease.  The left adrenal gland exhibits a mass measuring 1.9 x 2.3 cm.No osseous lesions seen. The breast parenchyma and the axillary regions show no acute abnormalities.  On 03/28/13 she had a  bronchoscopy 03/29/2013 (Dr. Melvyn Novas) showed bronchial lavage, case number in the [id number] malignant cells. Per Pulmonary report, this is  consistent with adenocarcinoma. Immunohistochemical stains are pending.ALK/EGFR mutations pending. She presented to [place] ED 2/[place]/2015 with worsening dysphagia and  vomiting. CXR on presentation was unchanged, showing persistent right lower lobe mass with associated effusion and obstructive pneumonia. If symptoms worsen, she will be scheduled for R thoracentesis. Pulmonary Service is involved in her care. We were requested to see this patient with recommendations.        PMH:  Past Medical History  Diagnosis Date  . Anxiety   . Depression   . Osteoporosis   . Hyperlipidemia   . Hypertension   . GERD (gastroesophageal reflux disease)   . Dysphagia   . Lung mass 02/2013 CT scan    right side, compressing esophagus    Surgeries:  Past Surgical History  Procedure Laterality Date  . Lumbar laminectomy    . Appendectomy    . Colonoscopy    . Video bronchoscopy Bilateral 03/29/2013    Procedure: VIDEO BRONCHOSCOPY WITHOUT FLUORO;  Surgeon: Tanda Rockers, MD;  Location: [facility];  Service: Cardiopulmonary;  Laterality: Bilateral;    Allergies:  Allergies  Allergen Reactions  . Cephalexin Hives and Swelling    Dentist gave  . Clindamycin Hives and Swelling    Dentist gave  . Codeine Hives and Swelling  . Demerol [Meperidine] Nausea And Vomiting  . Hydrocodone-Acetaminophen     Might have been give in ER  . Penicillins Hives and Swelling    Medications:  Scheduled Meds: .  enoxaparin (LOVENOX) injection  30 mg Subcutaneous Q24H   Continuous Infusions: . sodium chloride Stopped (03/31/13 1854)   PRN Meds:.ketorolac, LORazepam, metoCLOPramide (REGLAN) injection, ondansetron (ZOFRAN) IV, ondansetron, zolpidem  ROS: Constitutional: Positive for 18 lb  weight loss  since dysphagia began. Negative for fever, chills or  night sweats.  Eyes: Negative for blurred vision and double vision.  Respiratory: Positive for productive cough. No hemoptysis.No hoarseness.No neck swelling.  Positive for shortness of breath on exertion.  Cardiovascular: Negative for left sided chest pain. No palpitations.  GI: Negative for  nausea, vomiting secondary  to dysphagia, without bile or blood. No diarrhea or constipation. No change in bowel caliber. No  Melena or hematochezia. No abdominal pain.  Positive GERD symptoms. GU: Negative for hematuria. No loss of urinary control. No urinary retention. Skin: Negative for itching. No rash. No petechia. No easy  Bruising. Musculoskeletal: Chronic back pain secondary to arthritis. Neurological: No headaches.No confusion. No motor or sensory deficits.  Family History:    Family History  Problem Relation Age of Onset  . Breast cancer      <50  . Ovarian cancer    . Coronary artery disease      <60  . Depression    . Arthritis    . Alcohol abuse    . Osteoporosis Mother   . Alzheimer's disease Mother   . Mental illness Mother     alzheimers  . Celiac disease Father   . Cancer Other     breast, ovarian   Father had silicosis.     Social History:  reports that she has never smoked. She has never used smokeless tobacco. She reports that she does not drink alcohol or use illicit drugs. She is widowed 1 son. Lives in [place]. She was independent until these events began. 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  Physical Exam    Filed Vitals:   04/01/13 2154  BP: 152/88  Pulse: 105  Temp: 97.9 F (36.6 C)  Resp: 20     Filed Weights   03/31/13 1830 04/01/13 0531  Weight: 92 lb 9.6 oz (42.003 kg) 92 lb [place] oz (41.958 kg)   General: 76 -year-old  in no acute distress A. and O. x3  well-developed, thin HEENT: Normocephalic, atraumatic, PERRLA. Sclerae anicteric. Oral cavity without thrush or lesions. NECK:supple. no thyromegaly, no cervical or supraclavicular adenopathy  LUNGS: clear bilaterally . No wheezing, rhonchi or rales. No axillary masses. BREASTS: not examined. CARDIOVASCULAR: regular rate and rhythm, no murmur , rubs or gallops ABDOMEN: soft nontender , bowel sounds x4. No HSM. No masses palpable.  GU/rectal: deferred. EXTREMITIES: no clubbing cyanosis or edema. No bruising or  petechial rash MUSCULOSKELETAL: no spinal tenderness.  NEURO: Non Focal. No Horner's.   Labs:  CBC   Recent Labs Lab 03/31/13 1555  WBC 5.0  HGB 13.[place]  HCT 40.3  PLT 316  MCV 92.0  MCH 31.5  MCHC 34.2  RDW 12.6  LYMPHSABS 1.0  MONOABS 0.7  EOSABS 0.1  BASOSABS 0.0     CMP    Recent Labs Lab 03/31/13 1345 04/01/13 0324 04/02/13 0350  NA 135* 137 133*  K 3.[place] 3.1* 3.9  CL 94* 98 99  CO2 21 19 13*  GLUCOSE 91 79 81  BUN 11 12 7   CREATININE 0.60 0.55 0.44*  CALCIUM 9.2 [place].5 [place].6  MG  --   --  1.9  AST  --  18 23  ALT  --  9 12  ALKPHOS  --  56 58  BILITOT  --  0.5 0.6        Component Value Date/Time   BILITOT 0.6 04/02/2013 0350   BILIDIR 0.0 02/01/2013 1004      Imaging Studies:  Dg Chest 2 View  2/[place]/2015   CLINICAL DATA:  Shortness of breath  EXAM: CHEST  2 VIEW  COMPARISON:  03/13/2013  FINDINGS: Cardiac shadow is stable. A scoliosis is again identified of the thoracolumbar spine. Increased density is again noted in the right lower lobe similar to that seen on the recent chest CT examination consistent with underlying mass and postobstructive change. A right-sided effusion is noted.  IMPRESSION: Persistent right lower lobe mass lesion with associated effusion and obstructive pneumonia.   Electronically Signed   By: Inez Catalina M.D.   On: 03/31/2013 14:27   Dg Chest 2 View  03/13/2013   CLINICAL DATA:  Dysphasia.  EXAM: CHEST  2 VIEW  COMPARISON:  None.  FINDINGS: Right lower lobe and right mid lobe atelectasis and/or infiltrates noted. Right pleural effusion present. Small left pleural effusion cannot be excluded. No pneumothorax. Heart size and pulmonary vascularity normal. Degenerative changes and scoliosis thoracic spine. Degenerative changes both shoulders. No acute bony abnormality.  IMPRESSION: 1. Prominent atelectasis and/or infiltrates in the lower lobe and possibly right middle lobe. Associated right pleural effusion.  2.  Tiny left pleural  effusion.   Electronically Signed   By: Marcello Moores  Register   On: 03/13/2013 16:37   Ct Chest W Contrast  03/25/2013   CLINICAL DATA:  Recent esophageal dilation, evaluate for periesophageal mass  EXAM: CT CHEST WITH CONTRAST  TECHNIQUE: Multidetector CT imaging of the chest was performed during intravenous contrast administration.  CONTRAST:  75 cc OMNIPAQUE IOHEXOL 300 MG/ML  SOLN intravenously  COMPARISON:  DG CHEST 2 VIEW dated 03/13/2013  FINDINGS: There is a large right hilar mass which measures 5 cm transversely x 3.4 cm AP x 3.4 cm longitudinally. There is a large right pleural effusion. There is a 1.3 cm diameter lymph node anterior to the right mainstem bronchus. There is poorly defined sub carinal lymphadenopathy which closely abuts the lower third of the esophagus. The cardiac chambers are normal in size. The caliber of the thoracic aorta is normal. The right hilar mass does produce mass effect upon the right main descending pulmonary artery.  At lung window settings. There is minimal atelectasis of the inferior aspect of the right upper lobe adjacent to the right hilar mass. There is moderate atelectasis of the posterior medial aspect of the right middle lobe and more significant atelectasis in the superior segment of the right lower lobe. The left lung exhibits no atelectasis or infiltrate, but there are numerous subcentimeter pulmonary nodules present likely reflecting intrapulmonary metastatic disease.  Within the upper abdomen the observed portions of the liver are normal. The left adrenal gland exhibits a mass measuring 1.9 x 2.3 cm. The thoracic vertebral bodies are preserved in height. There is mild disc space narrowing at several levels. The sternum appears intact. No acute rib lesion is demonstrated. The breast parenchyma and the axillary regions exhibit no acute abnormalities.  IMPRESSION: 1. There is a large right hilar mass worrisome for malignancy. Its medial border abuts the lower 1/3 of the  thoracic esophagus. There is also a large right pleural effusion. There is atelectasis of portions of the right lung adjacent to the hilar mass and pleural effusion. 2. There are multiple subcentimeter nodules within the  otherwise normal-appearing left lung worrisome for intra- pulmonary metastatic disease. 3. There are borderline to mildly enlarged mediastinal lymph nodes. 4. There is enlargement of the left adrenal gland worrisome for metastatic disease.   Electronically Signed   By: David  [place]   On: 03/25/2013 15:27      A/P: 80 y.o. female  Found to have a   large right hilar mass requiring further workup while being evaluated for dysphagia, which required a CT of the chest showing the mass to be  accompanied by  large right pleural effusion, multiple subcentimeter nodules, mediastinal lymph nodes,And  enlargement of the left adrenal gland. Bronchoscopy on 2/6 was consistent with malignant cells consistent with adenocarcinoma. Molecular studies for ALK/EGFR mutations are pending. We were kindly requested to evaluate the patient.  Dr. Julien Nordmann    is to see the patient following this consult with recommendations regarding diagnosis, treatment options and further workup studies which may include staging CTs completion to rule out metastatic disease. She may need Radiation Oncology consultation while in hospital. An addendum to this note is to be written.  Thank you for the referral.  Rondel Jumbo, PA-C 04/02/2013 7:33 AM  ADDENDUM: Hematology/Oncology Attending: The patient is seen and examined today. I agree with the above note. This is a very pleasant 80 years old never smoker white female who is complaining of progressive dysphagia over the last few months and has evaluation by gastroenterology where stenosis was found on the mid-third of the esophagus questionable for extrinsic compression. CT scan of the chest with contrast was performed on 03/25/2013 and showed a large right hilar mass  measuring 5.0 x 3.4 x 3.4 CM suspicious for malignancy. There was also a large right-sided pleural effusion as well as mediastinal lymphadenopathy including a subcarinal lymphadenopathy which closely abuts the lower third of the esophagus. The right hilar mass does produce mass effect upon the right main descending pulmonary artery. There was also subcentimeter pulmonary nodules in addition to left adrenal gland metastasis. The patient was seen by Dr. Melvyn Novas and bronchoscopy was performed on 03/29/2013 and the final pathology was consistent with adenocarcinoma. I was asked to see the patient today for evaluation and recommendation regarding treatment of her condition. I had a lengthy discussion with the patient earlier today and then another discussion this evening with the patient and her son and her daughter-in-law about her current disease status and treatment options. Assessment and plan: This is a very pleasant 80 years old never smoker white female with stage IV non-small cell lung cancer, adenocarcinoma complicated with dysphagia secondary to extrinsic compression of the esophagus with the subcarinal lymphadenopathy. I discussed the condition with the patient and her family and recommended for them the following: 1) I will complete the staging workup by ordering MRI of the brain as well as CT scan of the abdomen and pelvis. The patient may also benefit from a PET scan but this can be performed an outpatient basis after discharge. 2) I would send the tissue block to be tested for EGFR mutation as well as ALK gene translocation which is a high probability in a patient with a never smoking history. If positive for mutation the patient may benefit from treatment with target biologic agents which gives a better survival and also better quality of life especially regarding the side effects. 3) I would consult radiation oncology for evaluation of the patient and consideration of palliative radiotherapy to the  large lung mass and mediastinal lymphadenopathy for relief of dysphagia.  4) for nutrition, the patient may be considered for a PEG tube placement temporarily until improvement of her dysphagia after the radiotherapy. This can be performed by gastroenterology or interventional radiology. 5) I also discussed with the patient and her family her other treatment options including palliative care and hospice referral versus systemic chemotherapy if she has no mutations. They would like to take some time to think about this option but chemotherapy would not start until the patient has some improvement in her dysphagia and the next few weeks. 6) for disposition, I think the patient may benefit from discharge to a skilled nursing facility with the ability to transport her to radiotherapy. Thank you so much for allowing me to participate in the care of Beth Collier. I will continue to follow the patient with you and assist in her management on as-needed basis. Total visit time with the patient and her family was over 80 minutes with more than 50% of the time on face to face counseling and discussion of her treatment options.  Disclaimer: This note was dictated with voice recognition software. Similar sounding words can inadvertently be transcribed and may not be corrected upon review.

## 2013-04-03 ENCOUNTER — Encounter (HOSPITAL_COMMUNITY): Payer: Self-pay | Admitting: Radiology

## 2013-04-03 ENCOUNTER — Ambulatory Visit
Admit: 2013-04-03 | Discharge: 2013-04-03 | Disposition: A | Discharge status: Home / Self Care | Payer: Medicare Other | Attending: Radiation Oncology | Admitting: Radiation Oncology

## 2013-04-03 ENCOUNTER — Inpatient Hospital Stay (HOSPITAL_COMMUNITY): Payer: Medicare Other

## 2013-04-03 DIAGNOSIS — C7949 Secondary malignant neoplasm of other parts of nervous system: Secondary | ICD-10-CM

## 2013-04-03 DIAGNOSIS — C7931 Secondary malignant neoplasm of brain: Secondary | ICD-10-CM

## 2013-04-03 DIAGNOSIS — E43 Unspecified severe protein-calorie malnutrition: Secondary | ICD-10-CM

## 2013-04-03 DIAGNOSIS — R4702 Dysphasia: Secondary | ICD-10-CM | POA: Diagnosis present

## 2013-04-03 DIAGNOSIS — R4789 Other speech disturbances: Secondary | ICD-10-CM

## 2013-04-03 LAB — PROTIME-INR
INR: 1.11 (ref 0.00–1.49)
PROTHROMBIN TIME: 14.1 s (ref 11.6–15.2)

## 2013-04-03 LAB — COMPREHENSIVE METABOLIC PANEL
ALT: 13 U/L (ref 0–35)
AST: 32 U/L (ref 0–37)
Albumin: 3.5 g/dL (ref 3.5–5.2)
Alkaline Phosphatase: 56 U/L (ref 39–117)
BILIRUBIN TOTAL: 0.4 mg/dL (ref 0.3–1.2)
BUN: 7 mg/dL (ref 6–23)
CHLORIDE: 102 meq/L (ref 96–112)
CO2: 11 mEq/L — ABNORMAL LOW (ref 19–32)
Calcium: 9 mg/dL (ref 8.4–10.5)
Creatinine, Ser: 0.46 mg/dL — ABNORMAL LOW (ref 0.50–1.10)
GFR calc Af Amer: >90 mL/min (ref >90)
GFR calc non Af Amer: >90 mL/min (ref >90)
Glucose, Bld: 93 mg/dL (ref 70–99)
Potassium: 3.4 mEq/L — ABNORMAL LOW (ref 3.7–5.3)
Sodium: 136 mEq/L — ABNORMAL LOW (ref 137–147)
Total Protein: 7.1 g/dL (ref 6.0–8.3)

## 2013-04-03 MED ORDER — IOHEXOL 300 MG/ML  SOLN
25.0000 mL | INTRAMUSCULAR | Status: AC
  Administered 2013-04-03: 25 mL via ORAL

## 2013-04-03 MED ORDER — IOHEXOL 300 MG/ML  SOLN
80.0000 mL | Freq: Once | INTRAMUSCULAR | Status: AC | PRN
  Administered 2013-04-03: 80 mL via INTRAVENOUS

## 2013-04-03 MED ORDER — POTASSIUM CHLORIDE IN NACL 40-0.9 MEQ/L-% IV SOLN
INTRAVENOUS | Status: DC
  Administered 2013-04-03 – 2013-04-04 (×2): via INTRAVENOUS
  Filled 2013-04-03 (×3): qty 1000

## 2013-04-03 MED ORDER — ENOXAPARIN SODIUM 30 MG/0.3ML ~~LOC~~ SOLN: 30.0000 mg | SUBCUTANEOUS | Status: DC

## 2013-04-03 NOTE — Progress Notes (Signed)
Radiation Oncology         [phone] ________________________________  Name: Beth Collier MRN: [id number]  Date: 03/31/2013  DOB: 05/19/1930   REFERRING PHYSICIAN:   Jacquelynn Cree, MD  DIAGNOSIS: metastatic non-small cell lung cancer   HISTORY OF PRESENT ILLNESS::Beth Collier is a 80 y.o. female who is seen for an initial consultation visit. The patient indicates that she has experienced significant dysphagia for at least one month. She indicates to me that she is unsure of the last time that she successfully had solid food, but believes this may have been one month ago. Since that time she has taken in liquids only but he states this became more difficult recently as well.  The patient was seen by GI and underwent an upper endoscopy on 03/21/2013. Stenosis was seen in the mid third of the esophagus which was felt to be most likely extrinsic compression. She had esophageal dilatation at that time. A CT scan was completed on 03/25/2013 of the chest. A large right hilar mass measuring 5 cm was seen in addition to a right pleural effusion. Final lymphadenopathy was also present and enlargement of the left adrenal gland was also noted. The mediastinal lymph nodes were borderline to mildly enlarged. Additional imaging has included a CT scan of the abdomen without clear findings of malignancy.  The patient proceeded to undergo bronchoscopy on 03/29/2013. Endobronchial biopsy of the right lower lobe mass demonstrated invasive adenocarcinoma of lung primary. Additional characterization is pending.  Patient has also undergone an MRI scan of the brain. This showed what was felt to be at least 7 enhancing supratentorial lesions concerning for metastatic disease.   PREVIOUS RADIATION THERAPY: No   PAST MEDICAL HISTORY:  has a past medical history of Anxiety; Depression; Osteoporosis; Hyperlipidemia; Hypertension; GERD (gastroesophageal reflux disease); Dysphagia; and Lung mass (02/2013 CT scan).      PAST SURGICAL HISTORY: Past Surgical History  Procedure Laterality Date  . Lumbar laminectomy    . Appendectomy    . Colonoscopy    . Video bronchoscopy Bilateral 03/29/2013    Procedure: VIDEO BRONCHOSCOPY WITHOUT FLUORO;  Surgeon: Tanda Rockers, MD;  Location: [facility];  Service: Cardiopulmonary;  Laterality: Bilateral;     FAMILY HISTORY: family history includes Alcohol abuse in an other family member; Alzheimer's disease in her mother; Arthritis in an other family member; Breast cancer in an other family member; Cancer in her other; Celiac disease in her father; Coronary artery disease in an other family member; Depression in an other family member; Mental illness in her mother; Osteoporosis in her mother; Ovarian cancer in an other family member.   SOCIAL HISTORY:  reports that she has never smoked. She has never used smokeless tobacco. She reports that she does not drink alcohol or use illicit drugs.   ALLERGIES: Cephalexin; Clindamycin; Codeine; Demerol; Hydrocodone-acetaminophen; and Penicillins   MEDICATIONS:  Current Facility-Administered Medications  Medication Dose Route Frequency Provider Last Rate Last Dose  . 0.9 % NaCl with KCl 40 mEq / L  infusion   Intravenous Continuous Venetia Maxon Rama, MD 75 mL/hr at 04/03/13 1148    . hydrALAZINE (APRESOLINE) injection 10 mg  10 mg Intravenous 3 times per day Robbie Lis, MD   10 mg at 04/03/13 1444  . ketorolac (TORADOL) 15 MG/ML injection 15 mg  15 mg Intravenous Q6H PRN Rhetta Mura Schorr, NP   15 mg at 04/03/13 0546  . LORazepam (ATIVAN) injection 0.5 mg  0.5 mg Intravenous QHS  PRN Shanda Howells, MD   0.5 mg at 04/02/13 2221  . metoCLOPramide (REGLAN) injection 5 mg  5 mg Intravenous Q8H PRN Brand Males, MD      . ondansetron (ZOFRAN) tablet 4 mg  4 mg Oral Q6H PRN Shanda Howells, MD       Or  . ondansetron [facility]) injection 4 mg  4 mg Intravenous Q6H PRN Shanda Howells, MD   4 mg at 04/03/13 1517  . zolpidem  (AMBIEN) tablet 5 mg  5 mg Oral QHS PRN Shanda Howells, MD   5 mg at 04/01/13 2131     REVIEW OF SYSTEMS:  A 15 point review of systems is documented in the electronic medical record. This was obtained by the nursing staff. However, I reviewed this with the patient to discuss relevant findings and make appropriate changes.  Pertinent items are noted in HPI.    PHYSICAL EXAM:  height is 4' 11.6" (1.514 m) and weight is 92 lb (41.731 kg). Her oral temperature is 98.1 F (36.7 C). Her blood pressure is 145/75 and her pulse is 106. Her respiration is 16 and oxygen saturation is 97%.   ECOG = 2  0 - Asymptomatic (Fully active, able to carry on all predisease activities without restriction)  1 - Symptomatic but completely ambulatory (Restricted in physically strenuous activity but ambulatory and able to carry out work of a light or sedentary nature. For example, light housework, office work)  2 - Symptomatic, <50% in bed during the day (Ambulatory and capable of all self care but unable to carry out any work activities. Up and about more than 50% of waking hours)  3 - Symptomatic, >50% in bed, but not bedbound (Capable of only limited self-care, confined to bed or chair 50% or more of waking hours)  4 - Bedbound (Completely disabled. Cannot carry on any self-care. Totally confined to bed or chair)  5 - Death   Eustace Pen MM, Creech RH, Tormey DC, et al. [phone]). "Toxicity and response criteria of the [facility]". [place] Oncol. 5 (6): 649-55  General: Well-developed, in no acute distress HEENT: Normocephalic, atraumatic Cardiovascular: Regular rate and rhythm Respiratory: Clear to auscultation bilaterally GI: Soft, nontender, normal bowel sounds Extremities: No edema present    LABORATORY DATA:  Lab Results  Component Value Date   WBC 5.0 03/31/2013   HGB 13.8 03/31/2013   HCT 40.3 03/31/2013   MCV 92.0 03/31/2013   PLT 316 03/31/2013   Lab Results  Component Value  Date   NA 136* 04/03/2013   K 3.4* 04/03/2013   CL 102 04/03/2013   CO2 11* 04/03/2013   Lab Results  Component Value Date   ALT 13 04/03/2013   AST 32 04/03/2013   ALKPHOS 56 04/03/2013   BILITOT 0.4 04/03/2013      RADIOGRAPHY: Dg Chest 2 View  03/31/2013   CLINICAL DATA:  Shortness of breath  EXAM: CHEST  2 VIEW  COMPARISON:  03/13/2013  FINDINGS: Cardiac shadow is stable. A scoliosis is again identified of the thoracolumbar spine. Increased density is again noted in the right lower lobe similar to that seen on the recent chest CT examination consistent with underlying mass and postobstructive change. A right-sided effusion is noted.  IMPRESSION: Persistent right lower lobe mass lesion with associated effusion and obstructive pneumonia.   Electronically Signed   By: Inez Catalina M.D.   On: 03/31/2013 14:27   Dg Chest 2 View  03/13/2013   CLINICAL  DATA:  Dysphasia.  EXAM: CHEST  2 VIEW  COMPARISON:  None.  FINDINGS: Right lower lobe and right mid lobe atelectasis and/or infiltrates noted. Right pleural effusion present. Small left pleural effusion cannot be excluded. No pneumothorax. Heart size and pulmonary vascularity normal. Degenerative changes and scoliosis thoracic spine. Degenerative changes both shoulders. No acute bony abnormality.  IMPRESSION: 1. Prominent atelectasis and/or infiltrates in the lower lobe and possibly right middle lobe. Associated right pleural effusion.  2.  Tiny left pleural effusion.   Electronically Signed   By: Marcello Moores  Register   On: 03/13/2013 16:37   Ct Chest W Contrast  03/25/2013   CLINICAL DATA:  Recent esophageal dilation, evaluate for periesophageal mass  EXAM: CT CHEST WITH CONTRAST  TECHNIQUE: Multidetector CT imaging of the chest was performed during intravenous contrast administration.  CONTRAST:  75 cc OMNIPAQUE IOHEXOL 300 MG/ML  SOLN intravenously  COMPARISON:  DG CHEST 2 VIEW dated 03/13/2013  FINDINGS: There is a large right hilar mass which measures 5 cm  transversely x 3.4 cm AP x 3.4 cm longitudinally. There is a large right pleural effusion. There is a 1.3 cm diameter lymph node anterior to the right mainstem bronchus. There is poorly defined sub carinal lymphadenopathy which closely abuts the lower third of the esophagus. The cardiac chambers are normal in size. The caliber of the thoracic aorta is normal. The right hilar mass does produce mass effect upon the right main descending pulmonary artery.  At lung window settings. There is minimal atelectasis of the inferior aspect of the right upper lobe adjacent to the right hilar mass. There is moderate atelectasis of the posterior medial aspect of the right middle lobe and more significant atelectasis in the superior segment of the right lower lobe. The left lung exhibits no atelectasis or infiltrate, but there are numerous subcentimeter pulmonary nodules present likely reflecting intrapulmonary metastatic disease.  Within the upper abdomen the observed portions of the liver are normal. The left adrenal gland exhibits a mass measuring 1.9 x 2.3 cm. The thoracic vertebral bodies are preserved in height. There is mild disc space narrowing at several levels. The sternum appears intact. No acute rib lesion is demonstrated. The breast parenchyma and the axillary regions exhibit no acute abnormalities.  IMPRESSION: 1. There is a large right hilar mass worrisome for malignancy. Its medial border abuts the lower 1/3 of the thoracic esophagus. There is also a large right pleural effusion. There is atelectasis of portions of the right lung adjacent to the hilar mass and pleural effusion. 2. There are multiple subcentimeter nodules within the otherwise normal-appearing left lung worrisome for intra- pulmonary metastatic disease. 3. There are borderline to mildly enlarged mediastinal lymph nodes. 4. There is enlargement of the left adrenal gland worrisome for metastatic disease.   Electronically Signed   By: David  Martinique    On: 03/25/2013 15:27   Mr Brain W Wo Contrast  04/03/2013   CLINICAL DATA:  Lung cancer staging, newly diagnosed hilar mass. Recent history of dysphagia.  EXAM: MRI HEAD WITHOUT AND WITH CONTRAST  TECHNIQUE: Multiplanar, multiecho pulse sequences of the brain and surrounding structures were obtained without and with intravenous contrast.  CONTRAST:  24mL MULTIHANCE GADOBENATE DIMEGLUMINE 529 MG/ML IV SOLN  COMPARISON:  CT of the head of report dated September 15, 2002 though images are not available for direct comparison.  FINDINGS: There are multiple subtle sub cm foci of abnormal enhancement within the supratentorial brain, along the periventricular and a gray-white matter  junction, some of the show minimal FLAIR and T2 hyperintense signal, though are poorly defined on the axial T1-1 postcontrast, this may reflect temporal changes as, contrast-enhanced sequences acquired at a slightly later phase better demonstrate the lesions. Lesions are as follows:  6 mm right periatrial white matter lesion, centrum semiovale 4 mm lesion (sagittal 12/21), left posterior frontal cortical 8 mm lesion (axial 34/52), left temporal 5 mm lesion (axial 19/52), high right parietal 4 mm lesion (sagittal 7/21), 2 right occipital 3-4 mm lesions (sagittal 5/21), possible left occipital 4 mm lesion (sagittal 16/21), possible right posterior frontal lesion measuring 7 mm (sagittal 8/21).  Left pontine capillary telangiectasias, left frontal developmental venous anomaly.  The ventricles and sulci are overall normal for patient's age. Patchy supratentorial white matter FLAIR T2 hyperintensities, exclusive of the aforementioned abnormalities may reflect chronic small vessel ischemic disease. Tiny focus of reduced diffusion associated with the right periatrial lesion, without reduced diffusion to suggest acute ischemia. No midline shift or mass effect. No susceptibility artifact to suggest hemorrhage.  No abnormal extra-axial fluid collections or  masses.  Ocular globes and orbital contents are nonsuspicious, status post bilateral ocular lens implants. Visualized paranasal sinuses and mastoid air cells are well aerated. Mild temporomandibular osteoarthrosis. No abnormal sellar expansion. No cerebellar tonsillar descent below the foramen magnum. Severe degenerative change of the included cervical spine.  IMPRESSION: At least 7 enhancing supratentorial lesions concerning for metastatic disease with additional possible left occipital right parietal lesions as described above. No midline shift or mass effect.  Moderate white matter changes suggest chronic small vessel ischemic disease. Mild brain atrophy.   Electronically Signed   By: Elon Alas   On: 04/03/2013 05:04   Ct Abdomen Pelvis W Contrast  04/03/2013   CLINICAL DATA:  Left-sided pain.  History of diverticulitis.  EXAM: CT ABDOMEN AND PELVIS WITH CONTRAST  TECHNIQUE: Multidetector CT imaging of the abdomen and pelvis was performed using the standard protocol following bolus administration of intravenous contrast.  CONTRAST:  68mL OMNIPAQUE IOHEXOL 300 MG/ML  SOLN  COMPARISON:  Chest CT, 03/25/2013  FINDINGS: There are scattered diverticula along the left colon, mostly along the sigmoid colon, but no evidence of diverticulitis.  There is mild inflammatory type change in the fat adjacent to the terminal ileum. What appears to be the appendix is closely approximated to the terminal ileum. It does not appear to be thickened.  Bowel is otherwise unremarkable.  Large right pleural effusion. Masslike consolidation is noted in the central right lower lobe. There multiple other pulmonary nodules. These findings are stable from the recent chest CT.  Low-density lesion lies in the medial segment of the left lobe of the liver adjacent to the falciform ligament. It measures 15 mm. This may reflect focal fat. Liver is otherwise unremarkable. Normal spleen. The gallbladder is distended but otherwise  unremarkable. No bile duct dilation. Normal pancreas. Mild thickening of the left adrenal gland suggesting hyperplasia. Normal right adrenal gland. Small low-density lesion arises from the upper pole of the right kidney, most likely a cyst. It is stable. Kidneys otherwise unremarkable. Normal ureters. The bladder is unremarkable.  2.1 cm hyper attenuating area in the upper uterine segment likely a fibroid. Uterus and adnexa are otherwise unremarkable.  No adenopathy.  No ascites.  There are marked degenerative changes as well as a scoliosis of the visible spine. No osteoblastic or osteolytic lesions.  IMPRESSION: 1. No evidence of diverticulitis. There are no findings to explain left lower quadrant pain. Patient  does have left colon diverticula. 2. Mild inflammatory changes seen adjacent to the terminal ileum. The appendix is closely approximated to the terminal ileum not well defined. It does not appear thickened. These findings are nonspecific. An infectious or inflammatory ileitis possible. No other evidence of acute abdomen or pelvic process. 3. Masslike opacity in the central right lower lobe associated with a large right pleural effusion and multiple additional pulmonary nodules. This is unchanged from the recent prior chest CT. 4. 15 mm area of hypoattenuation in the medial segment of the left lobe of the liver most likely an area of focal fatty infiltration. Mild left adrenal thickening likely hyperplasia. There are significant degenerative changes throughout the visualized spine.   Electronically Signed   By: Lajean Manes M.D.   On: 04/03/2013 12:03       IMPRESSION: The patient has a recent diagnosis of metastatic adenocarcinoma of lung primary. MRI scan of the brain shows brain metastasis and she also has a large right hilar mass which does abut the mid esophagus. I have been asked to see the patient for consideration of palliative radiation treatment to the dominant lung mass. The patient also is an  appropriate candidate for cranial radiation given her brain metastases.  I therefore discussed with the patient a palliative course of radiation treatment to the lung. I discussed the potential benefit of such a treatment, especially with regards to possible improvement in swallowing. We also discussed the potential side effects and risks of treatment as well. I also discussed with her the potential radiation treatment options for cranial radiation. She strongly favor is radiosurgery if possible.   PLAN:  -the patient will proceed with simulation tomorrow on 04/04/2013 and we will begin palliative radiation treatment to the lung as soon as possible. I anticipate 10 treatments over a 2 week period.  -The patient may benefit from placement of a feeding tube. She has significant difficulty with swallowing and improvement in swallowing secondary to radiation treatment may be delayed.  -I will coordinate a potential course of radiosurgery as an outpatient. She will require a SRS-protocol brain MRI scan as well as additional treatment planning. She will be able to proceed with radiosurgery if no additional lesions are found. If additional findings however are present, in her treatment plan will need to be altered. The patient had some minimal edema surrounding the intracranial lesions. She may benefit from a moderate dose of Decadron such as 4 mg twice a day. This can be tapered after radiation treatment to the brain.     ________________________________   Jodelle Gross, MD, PhD

## 2013-04-03 NOTE — Progress Notes (Signed)
PROGRESS NOTE   Beth Collier RCB:[id number] DOB: 01-10-1931 DOA: 03/31/2013 PCP: Kathlene November, MD  Brief narrative: Beth Collier is an 80 y.o. female with a PMH of recently diagnosed adenocarcinoma of the lung, status post bronchoscopy 03/28/13 as well as recent evaluation for dysphasia status post esophageal dilatation with endoscopy 03/21/13 with dysphasia determined to be secondary to extrinsic compression of the esophagus from lung mass, who was admitted on 03/31/13 with worsening dysphasia and vomiting.  Assessment/Plan: Principal Problem:   Acquired dysphasia secondary to extrinsic compression of the esophagus from lung mass associated with nausea and vomiting / dehydration The patient was admitted and placed on bowel rest with IV fluid hydration. Oncology/radiation oncology consultation was requested for further treatment recommendations, since her dysphasia is related to her underlying malignancy. She will need a PEG tube for nutritional purposes. Radiation oncology consult is pending. Continue IV fluids. Active Problems:   Adenocarcinoma of lung, stage 4 with brain metastasis See Dr. Worthy Flank consult note for a discussion of current treatment options presented to the patient.   Pleural effusion Pulmonology evaluated the patient on 04/01/13. Recommended right thoracentesis depending on course and symptoms if needed.   Hypokalemia Add potassium to IV fluids.   Protein-calorie malnutrition, severe We'll need to consider PEG tube placement if patient agreeable (indicates she is).   DVT Prophylaxis Continue Lovenox.  Code Status: Full. Family Communication: No family at bedside. Disposition Plan: ? SNF.   IV access:  Peripheral IV  Medical Consultants:  Dr. Curt Bears, Oncology  Radiation oncology  Dr. Brand Males, Pulmonology  Other Consultants:  Dietitian  Anti-infectives:  None  HPI/Subjective: Beth Collier has had a little nausea but no frank  vomiting. No dyspnea or cough. No complaints of pain.  Objective: Filed Vitals:   04/02/13 0530 04/02/13 1415 04/02/13 2152 04/03/13 0529  BP: 150/88 160/84 143/87 139/76  Pulse: 107 124 104 125  Temp: 98.1 F (36.7 C) 98.2 F (36.8 C) 98.3 F (36.8 C) 98.7 F (37.1 C)  TempSrc: Oral Oral Oral Oral  Resp: 20 20 18 16   Height:      Weight: 41.731 kg (92 lb)     SpO2: 98% 99% 97% 97%    Intake/Output Summary (Last 24 hours) at 04/03/13 1034 Last data filed at 04/03/13 0905  Gross per 24 hour  Intake      0 ml  Output   1400 ml  Net  -1400 ml    Exam: Gen:  NAD Cardiovascular:  Tachycardic, No M/R/G Respiratory:  Lungs CTAB, but diminished Gastrointestinal:  Abdomen soft, NT/ND, + BS Extremities:  No C/E/C  Data Reviewed: Basic Metabolic Panel:  Recent Labs Lab 03/31/13 1345 04/01/13 0324 04/02/13 0350 04/03/13 0345  NA 135* 137 133* 136*  K 3.8 3.1* 3.9 3.4*  CL 94* 98 99 102  CO2 21 19 13* 11*  GLUCOSE 91 79 81 93  BUN 11 12 7 7   CREATININE 0.60 0.55 0.44* 0.46*  CALCIUM 9.2 8.5 8.6 9.0  MG  --   --  1.9  --   PHOS  --   --  2.4  --    GFR Estimated Creatinine Clearance: 36.3 ml/min (by C-G formula based on Cr of 0.46). Liver Function Tests:  Recent Labs Lab 04/01/13 0324 04/02/13 0350 04/03/13 0345  AST 18 23 32  ALT 9 12 13   ALKPHOS 56 58 56  BILITOT 0.5 0.6 0.4  PROT 6.6 6.8 7.1  ALBUMIN 3.3* 3.5  3.5   CBC:  Recent Labs Lab 03/31/13 1555  WBC 5.0  NEUTROABS 3.2  HGB 13.8  HCT 40.3  MCV 92.0  PLT 316   Cardiac Enzymes:  Recent Labs Lab 03/31/13 1345  TROPONINI <0.30   Microbiology Recent Results (from the past 240 hour(s))  AFB CULTURE WITH SMEAR     Status: None   Collection Time    03/29/13  8:33 AM      Result Value Ref Range Status   Specimen Description BRONCHIAL ALVEOLAR LAVAGE   Final   Special Requests Normal   Final   ACID FAST SMEAR     Final   Value: NO ACID FAST BACILLI SEEN     Performed at [facility]   Culture     Final   Value: CULTURE WILL BE EXAMINED FOR 6 WEEKS BEFORE ISSUING A FINAL REPORT     Performed at [facility]   Report Status PENDING   Incomplete  FUNGUS CULTURE W SMEAR     Status: None   Collection Time    03/29/13  8:33 AM      Result Value Ref Range Status   Specimen Description BRONCHIAL ALVEOLAR LAVAGE   Final   Special Requests Normal   Final   Fungal Smear     Final   Value: NO YEAST OR FUNGAL ELEMENTS SEEN     Performed at [facility]   Culture     Final   Value: CULTURE IN PROGRESS FOR FOUR WEEKS     Performed at [facility]   Report Status PENDING   Incomplete  URINE CULTURE     Status: None   Collection Time    03/31/13  3:10 PM      Result Value Ref Range Status   Specimen Description URINE, CATHETERIZED   Final   Special Requests NONE   Final   Culture  Setup Time     Final   Value: 03/31/2013 22:28     Performed at [facility]     Final   Value: NO GROWTH     Performed at [facility]   Culture     Final   Value: NO GROWTH     Performed at [facility]   Report Status 04/01/2013 FINAL   Final     Procedures and Diagnostic Studies: Dg Chest 2 View  03/31/2013   CLINICAL DATA:  Shortness of breath  EXAM: CHEST  2 VIEW  COMPARISON:  03/13/2013  FINDINGS: Cardiac shadow is stable. A scoliosis is again identified of the thoracolumbar spine. Increased density is again noted in the right lower lobe similar to that seen on the recent chest CT examination consistent with underlying mass and postobstructive change. A right-sided effusion is noted.  IMPRESSION: Persistent right lower lobe mass lesion with associated effusion and obstructive pneumonia.   Electronically Signed   By: Inez Catalina M.D.   On: 03/31/2013 14:27    Scheduled Meds: . enoxaparin (LOVENOX) injection  30 mg Subcutaneous Q24H  . hydrALAZINE  10 mg Intravenous 3 times per day   Continuous Infusions: .  sodium chloride 75 mL/hr at 04/02/13 1104    Time spent: 35 minutes with > 50% of time discussing current diagnostic test results, clinical impression and plan of care.    LOS: 3 days   [facility] Pager [phone]. If unable to reach me by pager, please call my  cell phone at [phone].  *Please note that the hospitalists switch teams on Wednesdays. Please call the flow manager at [phone] if you are having difficulty reaching the hospitalist taking care of this patient as she can update you and provide the most up-to-date pager number of provider caring for the patient. If 8PM-8AM, please contact night-coverage at [email or web address], password [facility]  04/03/2013, 10:34 AM    **Disclaimer: This note was dictated with voice recognition software. Similar sounding words can inadvertently be transcribed and this note may contain transcription errors which may not have been corrected upon publication of note.**      In an effort to keep you and your family informed about your hospital stay, I am providing you with this information sheet. If you or your family have any questions, please do not hesitate to have the nursing staff page me to set up a meeting time.  Beth Collier 04/03/2013 3 (Number of days in the hospital)  Treatment team:  Dr. Jacquelynn Cree, Hospitalist (Internist)  Dr. Curt Bears, Oncology  Radiation oncology  Dr. Brand Males, Pulmonology  Active Treatment Issues with Plan: Principal Problem:   Inability to swallow secondary to compression of the esophagus from lung mass associated with nausea and vomiting / dehydration Oncology/radiation oncology consultation was requested for further treatment recommendations, since your inability to swallow is related to to an underlying lung mass. We will need to consider a feeding tube for nutritional purposes. Radiation oncology consult is pending. Continue IV fluids to treat dehydration. Active  Problems:   Advanced lung cancer Dr. Julien Nordmann will present to with treatment options once the final pathology is known.   Fluid around the lung The lung doctors are following your care. If he becomes significantly short of breath, the fluid can be drained.   Low potassium levels We will add potassium to IV fluids.   Malnutrition We'll need to consider placing a feeding tube through your skin into your stomach. Blood clot prevention Continue Lovenox.  Anticipated discharge date: Dependent on progress.

## 2013-04-03 NOTE — Progress Notes (Signed)
Subjective: The patient is seen and examined today. She is feeling fine today with no specific complaints except for the fatigue and dysphagia. She had MRI of the brain performed last night and it showed several brain metastases.  Objective: Vital signs in last 24 hours: Temp:  [98.1 F (36.7 C)-98.7 F (37.1 C)] 98.2 F (36.8 C) (02/11 2119) Pulse Rate:  [67-125] 67 (02/11 2119) Resp:  [16-18] 18 (02/11 2119) BP: (113-145)/(71-76) 113/71 mmHg (02/11 2119) SpO2:  [97 %] 97 % (02/11 2119)  Intake/Output from previous day: 02/10 0701 - 02/11 0700 In: 0  Out: 1775 [Urine:1775] Intake/Output this shift:    General appearance: alert, cooperative, fatigued and no distress Resp: clear to auscultation bilaterally Cardio: regular rate and rhythm, S1, S2 normal, no murmur, click, rub or gallop GI: soft, non-tender; bowel sounds normal; no masses,  no organomegaly Extremities: extremities normal, atraumatic, no cyanosis or edema  Lab Results:  No results found for this basename: WBC, HGB, HCT, PLT,  in the last 72 hours BMET  Recent Labs  04/02/13 0350 04/03/13 0345  NA 133* 136*  K 3.9 3.4*  CL 99 102  CO2 13* 11*  GLUCOSE 81 93  BUN 7 7  CREATININE 0.44* 0.46*  CALCIUM 8.6 9.0    Studies/Results: Mr Jeri Cos Wo Contrast  04/03/2013   CLINICAL DATA:  Lung cancer staging, newly diagnosed hilar mass. Recent history of dysphagia.  EXAM: MRI HEAD WITHOUT AND WITH CONTRAST  TECHNIQUE: Multiplanar, multiecho pulse sequences of the brain and surrounding structures were obtained without and with intravenous contrast.  CONTRAST:  46m MULTIHANCE GADOBENATE DIMEGLUMINE 529 MG/ML IV SOLN  COMPARISON:  CT of the head of report dated September 15, 2002 though images are not available for direct comparison.  FINDINGS: There are multiple subtle sub cm foci of abnormal enhancement within the supratentorial brain, along the periventricular and a gray-white matter junction, some of the show minimal FLAIR  and T2 hyperintense signal, though are poorly defined on the axial T1-1 postcontrast, this may reflect temporal changes as, contrast-enhanced sequences acquired at a slightly later phase better demonstrate the lesions. Lesions are as follows:  6 mm right periatrial white matter lesion, centrum semiovale 4 mm lesion (sagittal 12/21), left posterior frontal cortical 8 mm lesion (axial 34/52), left temporal 5 mm lesion (axial 19/52), high right parietal 4 mm lesion (sagittal 7/21), 2 right occipital 3-4 mm lesions (sagittal 5/21), possible left occipital 4 mm lesion (sagittal 16/21), possible right posterior frontal lesion measuring 7 mm (sagittal 8/21).  Left pontine capillary telangiectasias, left frontal developmental venous anomaly.  The ventricles and sulci are overall normal for patient's age. Patchy supratentorial white matter FLAIR T2 hyperintensities, exclusive of the aforementioned abnormalities may reflect chronic small vessel ischemic disease. Tiny focus of reduced diffusion associated with the right periatrial lesion, without reduced diffusion to suggest acute ischemia. No midline shift or mass effect. No susceptibility artifact to suggest hemorrhage.  No abnormal extra-axial fluid collections or masses.  Ocular globes and orbital contents are nonsuspicious, status post bilateral ocular lens implants. Visualized paranasal sinuses and mastoid air cells are well aerated. Mild temporomandibular osteoarthrosis. No abnormal sellar expansion. No cerebellar tonsillar descent below the foramen magnum. Severe degenerative change of the included cervical spine.  IMPRESSION: At least 7 enhancing supratentorial lesions concerning for metastatic disease with additional possible left occipital right parietal lesions as described above. No midline shift or mass effect.  Moderate white matter changes suggest chronic small vessel ischemic disease. Mild  brain atrophy.   Electronically Signed   By: Elon Alas   On:  04/03/2013 05:04   Ct Abdomen Pelvis W Contrast  04/03/2013   CLINICAL DATA:  Left-sided pain.  History of diverticulitis.  EXAM: CT ABDOMEN AND PELVIS WITH CONTRAST  TECHNIQUE: Multidetector CT imaging of the abdomen and pelvis was performed using the standard protocol following bolus administration of intravenous contrast.  CONTRAST:  84m OMNIPAQUE IOHEXOL 300 MG/ML  SOLN  COMPARISON:  Chest CT, 03/25/2013  FINDINGS: There are scattered diverticula along the left colon, mostly along the sigmoid colon, but no evidence of diverticulitis.  There is mild inflammatory type change in the fat adjacent to the terminal ileum. What appears to be the appendix is closely approximated to the terminal ileum. It does not appear to be thickened.  Bowel is otherwise unremarkable.  Large right pleural effusion. Masslike consolidation is noted in the central right lower lobe. There multiple other pulmonary nodules. These findings are stable from the recent chest CT.  Low-density lesion lies in the medial segment of the left lobe of the liver adjacent to the falciform ligament. It measures 15 mm. This may reflect focal fat. Liver is otherwise unremarkable. Normal spleen. The gallbladder is distended but otherwise unremarkable. No bile duct dilation. Normal pancreas. Mild thickening of the left adrenal gland suggesting hyperplasia. Normal right adrenal gland. Small low-density lesion arises from the upper pole of the right kidney, most likely a cyst. It is stable. Kidneys otherwise unremarkable. Normal ureters. The bladder is unremarkable.  2.1 cm hyper attenuating area in the upper uterine segment likely a fibroid. Uterus and adnexa are otherwise unremarkable.  No adenopathy.  No ascites.  There are marked degenerative changes as well as a scoliosis of the visible spine. No osteoblastic or osteolytic lesions.  IMPRESSION: 1. No evidence of diverticulitis. There are no findings to explain left lower quadrant pain. Patient does  have left colon diverticula. 2. Mild inflammatory changes seen adjacent to the terminal ileum. The appendix is closely approximated to the terminal ileum not well defined. It does not appear thickened. These findings are nonspecific. An infectious or inflammatory ileitis possible. No other evidence of acute abdomen or pelvic process. 3. Masslike opacity in the central right lower lobe associated with a large right pleural effusion and multiple additional pulmonary nodules. This is unchanged from the recent prior chest CT. 4. 15 mm area of hypoattenuation in the medial segment of the left lobe of the liver most likely an area of focal fatty infiltration. Mild left adrenal thickening likely hyperplasia. There are significant degenerative changes throughout the visualized spine.   Electronically Signed   By: DLajean ManesM.D.   On: 04/03/2013 12:03    Medications: I have reviewed the patient's current medications.   Assessment/Plan: 1) metastatic non-small cell lung cancer, adenocarcinoma in a never smoker: I requested the tissue block to be sent for EGFR mutation as well as ALK gene translocation. I will complete the staging workup. I ordered during CT of the abdomen and pelvis today. 2) metastatic brain lesions: at least 7 small lesions present on the recent MRI of the brain. The patient will be seen by radiation oncology later today. 3) dysphagia: secondary to a large subcarinal lymphadenopathy. The patient did benefit from a PEG tube placement for nutrition. She is expected to see radiation oncology later today for consideration of palliative radiotherapy to this area.    LOS: 3 days    Kathrina Crosley K. 04/03/2013

## 2013-04-04 ENCOUNTER — Telehealth: Payer: Self-pay | Admitting: *Deleted

## 2013-04-04 ENCOUNTER — Inpatient Hospital Stay (HOSPITAL_COMMUNITY): Payer: Medicare Other

## 2013-04-04 ENCOUNTER — Encounter (HOSPITAL_COMMUNITY): Payer: Self-pay | Admitting: Radiology

## 2013-04-04 ENCOUNTER — Ambulatory Visit: Payer: Medicare Other | Admitting: Radiation Oncology

## 2013-04-04 ENCOUNTER — Ambulatory Visit
Admit: 2013-04-04 | Discharge: 2013-04-04 | Disposition: A | Discharge status: Home / Self Care | Payer: Medicare Other | Attending: Radiation Oncology | Admitting: Radiation Oncology

## 2013-04-04 DIAGNOSIS — R5383 Other fatigue: Secondary | ICD-10-CM

## 2013-04-04 DIAGNOSIS — R5381 Other malaise: Secondary | ICD-10-CM

## 2013-04-04 DIAGNOSIS — C3430 Malignant neoplasm of lower lobe, unspecified bronchus or lung: Secondary | ICD-10-CM

## 2013-04-04 LAB — BASIC METABOLIC PANEL
BUN: 10 mg/dL (ref 6–23)
CALCIUM: 9.1 mg/dL (ref 8.4–10.5)
CO2: 11 mEq/L — ABNORMAL LOW (ref 19–32)
CREATININE: 0.47 mg/dL — AB (ref 0.50–1.10)
Chloride: 109 mEq/L (ref 96–112)
GFR calc non Af Amer: >90 mL/min (ref >90)
Glucose, Bld: 110 mg/dL — ABNORMAL HIGH (ref 70–99)
Potassium: 3.7 mEq/L (ref 3.7–5.3)
SODIUM: 137 meq/L (ref 137–147)

## 2013-04-04 LAB — LACTIC ACID, PLASMA: Lactic Acid, Venous: 1.7 mmol/L (ref 0.5–2.2)

## 2013-04-04 LAB — KETONES, QUALITATIVE

## 2013-04-04 MED ORDER — IOHEXOL 300 MG/ML  SOLN
20.0000 mL | Freq: Once | INTRAMUSCULAR | Status: AC | PRN
  Administered 2013-04-04: 10 mL

## 2013-04-04 MED ORDER — HYDROMORPHONE HCL PF 1 MG/ML IJ SOLN
1.0000 mg | INTRAMUSCULAR | Status: DC | PRN
  Administered 2013-04-04 – 2013-04-06 (×4): 1 mg via INTRAVENOUS
  Filled 2013-04-04 (×4): qty 1

## 2013-04-04 MED ORDER — MIDAZOLAM HCL 2 MG/2ML IJ SOLN
INTRAMUSCULAR | Status: AC | PRN
  Administered 2013-04-04 (×3): 0.5 mg via INTRAVENOUS

## 2013-04-04 MED ORDER — HYDROCODONE-ACETAMINOPHEN 5-325 MG PO TABS: 1.0000 | ORAL_TABLET | ORAL | Status: DC | PRN

## 2013-04-04 MED ORDER — FENTANYL CITRATE 0.05 MG/ML IJ SOLN
INTRAMUSCULAR | Status: AC
Start: 2013-04-04 — End: 2013-04-05
  Filled 2013-04-04: qty 6

## 2013-04-04 MED ORDER — VANCOMYCIN HCL IN DEXTROSE 1-5 GM/200ML-% IV SOLN
1000.0000 mg | Freq: Once | INTRAVENOUS | Status: AC
  Administered 2013-04-04: 1000 mg via INTRAVENOUS
  Filled 2013-04-04 (×2): qty 200

## 2013-04-04 MED ORDER — LIDOCAINE HCL 1 % IJ SOLN
INTRAMUSCULAR | Status: AC
  Filled 2013-04-04: qty 20

## 2013-04-04 MED ORDER — OSMOLITE 1.2 CAL PO LIQD: 1000.0000 mL | ORAL | Status: DC

## 2013-04-04 MED ORDER — FENTANYL CITRATE 0.05 MG/ML IJ SOLN
INTRAMUSCULAR | Status: AC | PRN
  Administered 2013-04-04: 25 ug via INTRAVENOUS

## 2013-04-04 MED ORDER — GLUCAGON HCL (RDNA) 1 MG IJ SOLR
INTRAMUSCULAR | Status: AC
  Filled 2013-04-04: qty 1

## 2013-04-04 MED ORDER — SODIUM BICARBONATE 8.4 % IV SOLN
INTRAVENOUS | Status: DC
  Administered 2013-04-04 – 2013-04-07 (×6): via INTRAVENOUS
  Filled 2013-04-04 (×10): qty 150

## 2013-04-04 MED ORDER — ONDANSETRON HCL 4 MG/2ML IJ SOLN
4.0000 mg | INTRAMUSCULAR | Status: DC | PRN
  Administered 2013-04-06 – 2013-04-07 (×2): 4 mg via INTRAVENOUS
  Filled 2013-04-04 (×2): qty 2

## 2013-04-04 MED ORDER — JEVITY 1.2 CAL PO LIQD: 1000.0000 mL | ORAL | Status: DC

## 2013-04-04 MED ORDER — MIDAZOLAM HCL 2 MG/2ML IJ SOLN
INTRAMUSCULAR | Status: AC
  Filled 2013-04-04: qty 6

## 2013-04-04 NOTE — Evaluation (Signed)
Physical Therapy Evaluation Patient Details Name: Beth Collier MRN: [id number] DOB: [birth year] Today's Date: 04/04/2013 Time: 0920-0940 PT Time Calculation (min): 20 min  PT Assessment / Plan / Recommendation History of Present Illness   a 80 y.o. year old female with recent evaluation for dysphagia with noted hilar mass s/p bronchoscopy 03/28/13 with Dr. Melvyn Novas  presenting with dehydration, dysphagia. She was initially seen by gastroenterology via Dr. Carlean Purl for solid food dysphagia. She did have an esophageal dilation with endoscopy done. Patient was referred to pulmonology as it was considered for an extra esophageal mass. Patient is noted to have a hilar mass on CT.  February 6. Patient was given . Per the patient's family, they were instructed by Dr. Melvyn Novas that if she developed any more vomiting or inability to tolerate by mouth intake, to go to the hospital for further evaluation. Patient denies any fevers or chills. Does have some I'll left-sided chest pain associated with cough. Has been able to hold very few liquids and no solids   Clinical Impression  Pt is very pleasant, mild balance loss. Pt will benefit from PT to address problems in order to return to independent level.    PT Assessment  Patient needs continued PT services    Follow Up Recommendations  Home health PT;Supervision/Assistance - 24 hour    Does the patient have the potential to tolerate intense rehabilitation      Barriers to Discharge        Equipment Recommendations  None recommended by PT    Recommendations for Other Services OT consult   Frequency Min 3X/week    Precautions / Restrictions Precautions Precaution Comments: NPO   Pertinent Vitals/Pain No noted issues.      Mobility  Bed Mobility Overal bed mobility: Independent Transfers Overall transfer level: Needs assistance Equipment used: 1 person hand held assist Transfers: Sit to/from Stand Sit to Stand:  Supervision Ambulation/Gait Ambulation/Gait assistance: Min guard Ambulation Distance (Feet): 50 Feet Assistive device: 1 person hand held assist Gait Pattern/deviations: Step-through pattern Gait velocity: decr    Exercises     PT Diagnosis: Difficulty walking;Generalized weakness  PT Problem List: Decreased strength;Decreased balance;Decreased activity tolerance;Decreased mobility;Decreased safety awareness;Decreased knowledge of precautions PT Treatment Interventions: DME instruction;Gait training;Functional mobility training;Therapeutic activities;Therapeutic exercise;Balance training;Patient/family education     PT Goals(Current goals can be found in the care plan section) Acute Rehab PT Goals Patient Stated Goal: I want to get back to being independent. I live by myself. PT Goal Formulation: With patient Time For Goal Achievement: 04/18/13 Potential to Achieve Goals: Good  Visit Information  Last PT Received On: 04/04/13 Assistance Needed: +1 History of Present Illness:  a 80 y.o. year old female with recent evaluation for dysphagia with noted hilar mass s/p bronchoscopy 03/28/13 with Dr. Melvyn Novas  presenting with dehydration, dysphagia. She was initially seen by gastroenterology via Dr. Carlean Purl for solid food dysphagia. She did have an esophageal dilation with endoscopy done. Patient was referred to pulmonology as it was considered for an extra esophageal mass. Patient is noted to have a hilar mass on CT. Bronchoscopy was done on February 6. Patient was given IV fluids and retirement setting of dehydration secondary to poor by mouth intake. Per the patient's family, they were instructed by Dr. Melvyn Novas that if she developed any more vomiting or inability to tolerate by mouth intake, to go to the hospital for further evaluation. Patient denies any fevers or chills. Does have some I'll left-sided chest pain associated with cough. Has  been able to hold very few liquids and no solids        Prior [place] expects to be discharged to:: Private residence Living Arrangements: Alone Available Help at Discharge: Available 24 hours/day;Family Type of Home: House Home Access: Stairs to enter [employer] of Steps: 1 Home Layout: One level Home Equipment: None Prior Function Level of Independence: Independent Communication Communication: No difficulties    Cognition  Cognition Arousal/Alertness: Awake/alert Behavior During Therapy: WFL for tasks assessed/performed Overall Cognitive Status: Within Functional Limits for tasks assessed    Extremity/Trunk Assessment Upper Extremity Assessment Upper Extremity Assessment: Overall WFL for tasks assessed Lower Extremity Assessment Lower Extremity Assessment: Overall WFL for tasks assessed Cervical / Trunk Assessment Cervical / Trunk Assessment: Kyphotic   Balance Balance Overall balance assessment: Needs assistance Sitting-balance support: No upper extremity supported;Feet supported Sitting balance-Leahy Scale: Normal Standing balance support: No upper extremity supported;During functional activity Standing balance-Leahy Scale: Fair Standing balance comment: static is FAIR, dynamic is FAIR-POOR, pt did lose balance x 1 and caught self against wall when turning around. High Level Balance Comments: pt was able to  stand and perform some of her hygiene for 5 minutes, able to prop  and lift each leg to don/doff panties with assistance  to keep balance.  End of Session PT - End of Session Activity Tolerance: Patient tolerated treatment well Patient left: in chair (going to radiation) Nurse Communication: Mobility status  GP     Beth Collier 04/04/2013, 10:14 AM Beth Collier PT [phone]

## 2013-04-04 NOTE — Progress Notes (Addendum)
PROGRESS NOTE   Beth Collier DOB: May 06, 1930 DOA: 03/31/2013 PCP: Kathlene November, MD  Brief narrative: Beth Collier is an 80 y.o. female with a PMH of recently diagnosed adenocarcinoma of the lung, status post bronchoscopy 03/28/13 as well as recent evaluation for dysphasia status post esophageal dilatation with endoscopy 03/21/13 with dysphasia determined to be secondary to extrinsic compression of the esophagus from lung mass, who was admitted on 03/31/13 with worsening dysphasia and vomiting. She had a PEG tube placed 04/04/13 and palliative care consultation as requested by family who do not want any further treatment decisions made until family has discussed her options with her treatment team including the oncologist and radiation oncologist.  Assessment/Plan: Principal Problem:   Acquired dysphasia secondary to extrinsic compression of the esophagus from lung mass associated with nausea and vomiting / dehydration The patient was admitted and placed on bowel rest with IV fluid hydration. Oncology/radiation oncology consultation was requested for further treatment recommendations, since her dysphasia is related to her underlying malignancy. PEG tube placed today after a full discussion with the patient about this 04/03/2013 (patient indicated she wanted to proceed and felt to be competent to make this decision, although the family disagrees and feels that the tube should not have been placed without discussing it with them first), we'll consult the dietitian for tube feeding recommendations. Was scheduled to undergo radiation simulation today, with the initiation of palliative radiation treatments as ordered by Dr. Lisbeth Renshaw however family requested to cancel the planned treatment.  (Note, family presented at the bedside later in the day and was upset that treatment recommendations were not fully discussed with them and approved by them prior to initiation. They requested radiation therapy be  held and requested a palliative care consultation.) Case discussed with Dr. Hilma Favors who will see the patient in consultation. Active Problems:   Metabolic acidosis Likely from starvation ketosis. Check ketones and lactic acid. Will add bicarbonate to IV fluids.   Adenocarcinoma of lung, stage 4 with brain metastasis See Dr. Worthy Flank consult note for a discussion of current treatment options presented to the patient. At this time, the family is deferring further treatment until they have a chance to discuss treatment options with the oncologist and radiation oncologist.   Pleural effusion Status post BAL 03/29/13, cultures negative to date. Pulmonology evaluated the patient on 04/01/13. Recommended right thoracentesis depending on course and symptoms if needed.   Hypokalemia Resolved with potassium added to IV fluids.   Protein-calorie malnutrition, severe PEG tube placed 04/03/13. Initiate tube feeds once tube cleared for use.   DVT Prophylaxis Continue Lovenox.  Code Status: Full. Family Communication: Son and daughter-in-law updated at bedside. Disposition Plan: ? SNF.   IV access:  Peripheral IV  Medical Consultants:  Dr. Curt Bears, Oncology  Dr. Marye Round, Radiation oncology  Dr. Brand Males, Pulmonology  Other Consultants:  Dietitian  Anti-infectives:  None  HPI/Subjective: Beth Collier has some mild tenderness at the PEG tube site. No current complaints of dyspnea or cough. She is weak but no focal deficits noted.  Objective: Filed Vitals:   04/03/13 0529 04/03/13 1445 04/03/13 2119 04/04/13 0540  BP: 139/76 145/75 113/71 113/49  Pulse: 125 106 67 67  Temp: 98.7 F (37.1 C) 98.1 F (36.7 C) 98.2 F (36.8 C) 98.8 F (37.1 C)  TempSrc: Oral Oral Oral Oral  Resp: 16 16 18 18   Height:      Weight:      SpO2:  97% 97% 97% 98%    Intake/Output Summary (Last 24 hours) at 04/04/13 1011 Last data filed at 04/04/13 1003  Gross per 24 hour    Intake   1365 ml  Output    525 ml  Net    840 ml    Exam: Gen:  NAD Cardiovascular:  Tachycardic, No M/R/G Respiratory:  Lungs CTAB, but diminished Gastrointestinal:  Abdomen soft, NT/ND, + BS, PEG tube left upper quadrant Extremities:  No C/E/C  Data Reviewed: Basic Metabolic Panel:  Recent Labs Lab 03/31/13 1345 04/01/13 0324 04/02/13 0350 04/03/13 0345 04/04/13 0410  NA 135* 137 133* 136* 137  K 3.8 3.1* 3.9 3.4* 3.7  CL 94* 98 99 102 109  CO2 21 19 13* 11* 11*  GLUCOSE 91 79 81 93 110*  BUN 11 12 7 7 10   CREATININE 0.60 0.55 0.44* 0.46* 0.47*  CALCIUM 9.2 8.5 8.6 9.0 9.1  MG  --   --  1.9  --   --   PHOS  --   --  2.4  --   --    GFR Estimated Creatinine Clearance: 36.3 ml/min (by C-G formula based on Cr of 0.47). Liver Function Tests:  Recent Labs Lab 04/01/13 0324 04/02/13 0350 04/03/13 0345  AST 18 23 32  ALT 9 12 13   ALKPHOS 56 58 56  BILITOT 0.5 0.6 0.4  PROT 6.6 6.8 7.1  ALBUMIN 3.3* 3.5 3.5   CBC:  Recent Labs Lab 03/31/13 1555  WBC 5.0  NEUTROABS 3.2  HGB 13.8  HCT 40.3  MCV 92.0  PLT 316   Cardiac Enzymes:  Recent Labs Lab 03/31/13 1345  TROPONINI <0.30   Microbiology Recent Results (from the past 240 hour(s))  AFB CULTURE WITH SMEAR     Status: None   Collection Time    03/29/13  8:33 AM      Result Value Ref Range Status   Specimen Description BRONCHIAL ALVEOLAR LAVAGE   Final   Special Requests Normal   Final   ACID FAST SMEAR     Final   Value: NO ACID FAST BACILLI SEEN     Performed at [facility]   Culture     Final   Value: CULTURE WILL BE EXAMINED FOR 6 WEEKS BEFORE ISSUING A FINAL REPORT     Performed at [facility]   Report Status PENDING   Incomplete  FUNGUS CULTURE W SMEAR     Status: None   Collection Time    03/29/13  8:33 AM      Result Value Ref Range Status   Specimen Description BRONCHIAL ALVEOLAR LAVAGE   Final   Special Requests Normal   Final   Fungal Smear     Final    Value: NO YEAST OR FUNGAL ELEMENTS SEEN     Performed at [facility]   Culture     Final   Value: CULTURE IN PROGRESS FOR FOUR WEEKS     Performed at [facility]   Report Status PENDING   Incomplete  URINE CULTURE     Status: None   Collection Time    03/31/13  3:10 PM      Result Value Ref Range Status   Specimen Description URINE, CATHETERIZED   Final   Special Requests NONE   Final   Culture  Setup Time     Final   Value: 03/31/2013 22:28     Performed at [facility]  Colony Count     Final   Value: NO GROWTH     Performed at [facility]   Culture     Final   Value: NO GROWTH     Performed at [facility]   Report Status 04/01/2013 FINAL   Final     Procedures and Diagnostic Studies: Dg Chest 2 View  03/31/2013   CLINICAL DATA:  Shortness of breath  EXAM: CHEST  2 VIEW  COMPARISON:  03/13/2013  FINDINGS: Cardiac shadow is stable. A scoliosis is again identified of the thoracolumbar spine. Increased density is again noted in the right lower lobe similar to that seen on the recent chest CT examination consistent with underlying mass and postobstructive change. A right-sided effusion is noted.  IMPRESSION: Persistent right lower lobe mass lesion with associated effusion and obstructive pneumonia.   Electronically Signed   By: Inez Catalina M.D.   On: 03/31/2013 14:27    Scheduled Meds: . hydrALAZINE  10 mg Intravenous 3 times per day  . vancomycin  1,000 mg Intravenous Once   Continuous Infusions: . 0.9 % NaCl with KCl 40 mEq / L 75 mL/hr at 04/04/13 0143    Time spent: 35 minutes with > 50% of time discussing current diagnostic test results, clinical impression and plan of care with the patient's family.    LOS: 4 days   [place] [facility] Pager [phone]. If unable to reach me by pager, please call my cell phone at [phone].  *Please note that the hospitalists switch teams on Wednesdays. Please call the flow  manager at [phone] if you are having difficulty reaching the hospitalist taking care of this patient as she can update you and provide the most up-to-date pager number of provider caring for the patient. If 8PM-8AM, please contact night-coverage at [email or web address], password [facility]  04/04/2013, 10:11 AM    **Disclaimer: This note was dictated with voice recognition software. Similar sounding words can inadvertently be transcribed and this note may contain transcription errors which may not have been corrected upon publication of note.**

## 2013-04-04 NOTE — Procedures (Signed)
22F gastrostomy tube placement under fluoro No complication No blood loss. See complete dictation in [facility] P[facility].  

## 2013-04-04 NOTE — Care Management Note (Signed)
Cm spoke with patient and adult son and daughter-n-law at bedside concerning discharge planning. PT recommendation for with 24 hr supervision. Pt provided choice list for [facility] and private duty care list as resource. Pt and family trying to arrange 24 care. Will continue to follow.    Venita Lick Erna Brossard,MSN,RN [phone]

## 2013-04-04 NOTE — Discharge Instructions (Signed)
Gastrostomy Tube Home Guide A gastrostomy tube is a tube that is surgically placed into the stomach. It is also called a "G-tube." G-tubes are used when a person is unable to eat and drink enough on their own to stay healthy. The tube is inserted into the stomach through a small cut (incision) in the skin. This tube is used for:  Feeding.  Giving medication. GASTROSTOMY TUBE CARE  Wash your hands with soap and water.  Remove the old dressing (if any). Some styles of G-tubes may need a dressing inserted between the skin and the G-tube. Other types of G-tubes do not require a dressing. Ask your health care provider if a dressing is needed.  Check the area where the tube enters the skin (insertion site) for redness, swelling, or pus-like (purulent) drainage. A small amount of clear or tan liquid drainage is normal. Check to make sure scar tissue (skin) is not growing around the insertion site. This could have a raised, bumpy appearance.  A cotton swab can be used to clean the skin around the tube:  When the G-tube is first put in, a normal saline solution or water can be used to clean the skin.  Mild soap and warm water can be used when the skin around the G-tube site has healed.  Roll the cotton swab around the G-tube insertion site to remove any drainage or crusting at the insertion site. STOMACH RESIDUALS Feeding tube residuals are the amount of liquids that are in the stomach at any given time. Residuals may be checked before giving feedings, medications, or as instructed by your health care provider.  Ask your health care provider if there are instances when you would not start tube feedings depending on the amount or type of contents withdrawn from the stomach.  Check residuals by attaching a syringe to the G-tube and pull back on the syringe plunger. Note the amount and return the residual back into the stomach. FLUSHING THE G-TUBE  The G-tube should be periodically flushed with  clean warm water to keep it from clogging.  Flush the G-tube after feedings or medications. Draw up 30 mL of warm water in a syringe. Connect the syringe to the G-tube and slowly push the water into the tube.  Do not push feedings, medications, or flushes rapidly. Flush the G-tube gently and slowly.  Only use syringes made for G-tubes to flush medications or feedings.  Your health care provider may want the G-tube flushed more often or with more water. If this is the case, follow your health care provider's instructions. FEEDINGS Your health care provider will determine whether feedings are given as a bolus (a certain amount given at one time and at scheduled times) or whether feedings will be given continuously on a feeding pump.   Formulas should be given at room temperature.  If feedings are continuous, no more than 4 hours worth of feedings should be placed in the feeding bag. This helps prevent spoilage or accidental excess infusion.  Cover and place unused formula in the refrigerator.  If feedings are continuous, stop the feedings when medications or flushes are given. Be sure to restart the feedings.  Feeding bags and syringes should be replaced as instructed by your health care provider. GIVING MEDICATION   In general, it is best if all medications are in a liquid form for G-tube administration. Liquid medications are less likely to clog the G-tube.  Mix the liquid medication with 30 mL (or amount recommended by your  health care provider) of warm water.  Draw up the medication into the syringe.  Attach the syringe to the G-tube and slowly push the mixture into the G-tube.  After giving the medication, draw up 30 mL of warm water in the syringe and slowly flush the G-tube.  For pills or capsules, check with your health care provider first before crushing medications. Some pills are not effective if they are crushed. Some capsules are sustained release medications.  If  appropriate, crush the pill or capsule and mix with 30 mL of warm water. Using the syringe, slowly push the medication through the tube, then flush the tube with another 30 mL of tap water. G-TUBE PROBLEMS G-tube was pulled out.  Cause: May have been pulled out accidentally.  Solutions: Cover the opening with clean dressing and tape. Call your health care provider right away. The G-tube should be put in as soon as possible (within 4 hours) so the G-tube opening (tract) does not close. The G-tube needs to be put in at a healthcare setting. An X-ray needs to be done to confirm placement before the G-tube can be used again. Redness, irritation, soreness, or foul odor around the gastrostomy site.  Cause: May be caused by leakage or infection.  Solutions: Call your health care provider right away. Large amount of leakage of fluid or mucus-like liquid present (a large amount means it soaks clothing).  Cause: Many reasons could cause the G-tube to leak.  Solutions: Call your health care provider to discuss the amount of leakage. Skin or scar tissue appears to be growing where tube enters skin.   Cause: Tissue growth may develop around the insertion site if the G-tube is moved or pulled on excessively.  Solutions: Secure tube with tape so that excess movement does not occur. Call your health care provider. G-tube is clogged.  Cause: Thick formula or medication.  Solutions: Try to slowly push warm water into the tube with a large syringe. Never try to push any object into the tube to unclog it. Do not force fluid into the G-tube. If you are unable to unclog the tube, call your health care provider right away. TIPS  Head of Bed [facility]) position refers to the upright position of a person's upper body.  When giving medications or a feeding bolus, keep the [facility] up as told by your health care provider. Do this during the feeding and for 1 hour after the feeding or medication administration.  If  continuous feedings are being given, it is best to keep the [facility] up as told by your health care provider. When ADLs (Activities of Daily Living) are performed and the [facility] needs to be flat, be sure to turn the feeding pump off. Restart the feeding pump when the [facility] is returned to the recommended height.  Do not pull or put tension on the tube.  To prevent fluid backflow, kink the G-tube before removing the cap or disconnecting a syringe.  Check the G-tube length every day. Measure from the insertion site to the end of the G-tube. If the length is longer than previous measurements, the tube may be coming out. Call your health care provider if you notice increasing G-tube length.  Oral care, such as brushing teeth, must be continued.  You may need to remove excess air (vent) from the G-tube. Your health care provider will tell you if this is needed.  Always call your health care provider if you have questions or problems with the G-tube.  SEEK IMMEDIATE MEDICAL CARE IF:   You have severe abdominal pain, tenderness, or abdominal bloating (distension).  You have nausea or vomiting.  You are constipated or have problems moving your bowels.  The G-tube insertion site is red, swollen, has a foul smell, or has yellow or brown drainage.  You have difficulty breathing or shortness of breath.  You have a fever.  You have a large amount of feeding tube residuals.  The G-tube is clogged and cannot be flushed. MAKE SURE YOU:   Understand these instructions.  Will watch your condition.  Will get help right away if you are not doing well or get worse. Document Released: 04/18/2001 Document Revised: 11/28/2012 Document Reviewed: 10/15/2012 [facility] Patient Information 2014 [place]. Moderate Sedation, Adult Moderate sedation is given to help you relax or even sleep through a procedure. You may remain sleepy, be clumsy, or have poor balance for several hours following this procedure.  Arrange for a responsible adult, family member, or friend to take you home. A responsible adult should stay with you for at least 24 hours or until the medicines have worn off.  Do not participate in any activities where you could become injured for the next 24 hours, or until you feel normal again. Do not:  Drive.  Swim.  Ride a bicycle.  Operate heavy machinery.  Cook.  Use power tools.  Climb ladders.  Work at [employer].  Do not make important decisions or sign legal documents until you are improved.  Vomiting may occur if you eat too soon. When you can drink without vomiting, try water, juice, or soup. Try solid foods if you feel little or no nausea.  Only take over-the-counter or prescription medications for pain, discomfort, or fever as directed by your caregiver.If pain medications have been prescribed for you, ask your caregiver how soon it is safe to take them.  Make sure you and your family fully understands everything about the medication given to you. Make sure you understand what side effects may occur.  You should not drink alcohol, take sleeping pills, or medications that cause drowsiness for at least 24 hours.  If you smoke, do not smoke alone.  If you are feeling better, you may resume normal activities 24 hours after receiving sedation.  Keep all appointments as scheduled. Follow all instructions.  Ask questions if you do not understand. SEEK MEDICAL CARE IF:   Your skin is pale or bluish in color.  You continue to feel sick to your stomach (nauseous) or throw up (vomit).  Your pain is getting worse and not helped by medication.  You have bleeding or swelling.  You are still sleepy or feeling clumsy after 24 hours. SEEK IMMEDIATE MEDICAL CARE IF:   You develop a rash.  You have difficulty breathing.  You develop any type of allergic problem.  You have a fever. Document Released: 11/02/2000 Document Revised: 05/02/2011 Document Reviewed:  10/15/2012 [facility] Patient Information 2014 [place].

## 2013-04-04 NOTE — Consult Note (Signed)
HPI: Beth Collier is an 80 y.o. female patient with metasttaic lung cancer. The lung mass is causing significant extrinsic compression of her esophagus, causing dysphagia. She has been on liquid diet. She is set to start radiation therapy and her dysphagia is expected to worsen. IR is asked to place gastrostomy tube. Chart and imaging reviewed. Recent EGD done shows esophagus is patent and infact, was able to tolerate 44 and 48Fr dilation. She is a candidate for percutaneous gastrostomy tube.   Past Medical History:  Past Medical History  Diagnosis Date  . Anxiety   . Depression   . Osteoporosis   . Hyperlipidemia   . Hypertension   . GERD (gastroesophageal reflux disease)   . Dysphagia   . Lung mass 02/2013 CT scan    right side, compressing esophagus    Past Surgical History:  Past Surgical History  Procedure Laterality Date  . Lumbar laminectomy    . Appendectomy    . Colonoscopy    . Video bronchoscopy Bilateral 03/29/2013    Procedure: VIDEO BRONCHOSCOPY WITHOUT FLUORO;  Surgeon: Tanda Rockers, MD;  Location: [facility];  Service: Cardiopulmonary;  Laterality: Bilateral;    Family History:  Family History  Problem Relation Age of Onset  . Breast cancer      <50  . Ovarian cancer    . Coronary artery disease      <60  . Depression    . Arthritis    . Alcohol abuse    . Osteoporosis Mother   . Alzheimer's disease Mother   . Mental illness Mother     alzheimers  . Celiac disease Father   . Cancer Other     breast, ovarian    Social History:  reports that she has never smoked. She has never used smokeless tobacco. She reports that she does not drink alcohol or use illicit drugs.  Allergies:  Allergies  Allergen Reactions  . Cephalexin Hives and Swelling    Dentist gave  . Clindamycin Hives and Swelling    Dentist gave  . Codeine Hives and Swelling  . Demerol [Meperidine] Nausea And Vomiting  . Hydrocodone-Acetaminophen     Might have been give in ER   . Penicillins Hives and Swelling    Medications:   Medication List    ASK your doctor about these medications       ALPRAZolam 0.5 MG tablet  Commonly known as:  XANAX  Take 0.5 mg by mouth at bedtime as needed for anxiety or sleep.     atenolol 25 MG tablet  Commonly known as:  TENORMIN  Take 1 tablet (25 mg total) by mouth daily.     hydroxypropyl methylcellulose 2.5 % ophthalmic solution  Commonly known as:  ISOPTO TEARS  Place 1 drop into both eyes 2 (two) times daily as needed for dry eyes.     ibuprofen 200 MG tablet  Commonly known as:  ADVIL,MOTRIN  Take 400 mg by mouth every 6 (six) hours as needed for headache or moderate pain.     imipramine 25 MG tablet  Commonly known as:  TOFRANIL  Take 3 tablets (75 mg total) by mouth at bedtime.     PRILOSEC OTC 20 MG tablet  Generic drug:  omeprazole  Take 20-40 mg by mouth daily as needed.     sodium chloride 0.65 % Soln nasal spray  Commonly known as:  OCEAN  Place 1 spray into both nostrils at bedtime as needed for congestion.  zolpidem 5 MG tablet  Commonly known as:  AMBIEN  Take 1 tablet (5 mg total) by mouth at bedtime as needed for sleep.        Please HPI for pertinent positives, otherwise complete 10 system ROS negative.  Physical Exam: BP 113/49  Pulse 67  Temp(Src) 98.8 F (37.1 C) (Oral)  Resp 18  Ht 4' 11.6" (1.514 m)  Wt 92 lb (41.731 kg)  BMI 18.21 kg/m2  SpO2 98% Body mass index is 18.21 kg/(m^2).   General Appearance:  Alert, cooperative, no distress, appears stated age  Head:  Normocephalic, without obvious abnormality, atraumatic  ENT: Unremarkable  Neck: Supple, symmetrical, trachea midline  Lungs:   Decreased (R)BS, clear left  Chest Wall:  No tenderness or deformity  Heart:  Regular rate and rhythm, S1, S2 normal, no murmur, rub or gallop.  Abdomen:   Soft, non-tender, non distended.  Extremities: Extremities normal, atraumatic, no cyanosis or edema  Pulses: 2+ and  symmetric  Neurologic: Normal affect, no gross deficits.   Results for orders placed during the hospital encounter of 03/31/13 (from the past 48 hour(s))  COMPREHENSIVE METABOLIC PANEL     Status: Abnormal   Collection Time    04/03/13  3:45 AM      Result Value Ref Range   Sodium 136 (*) 137 - 147 mEq/L   Comment: REPEATED TO VERIFY   Potassium 3.4 (*) 3.7 - 5.3 mEq/L   Comment: SLIGHT HEMOLYSIS     HEMOLYSIS AT THIS LEVEL MAY AFFECT RESULT   Chloride 102  96 - 112 mEq/L   Comment: REPEATED TO VERIFY   CO2 11 (*) 19 - 32 mEq/L   Comment: REPEATED TO VERIFY   Glucose, Bld 93  70 - 99 mg/dL   BUN 7  6 - 23 mg/dL   Creatinine, Ser 0.46 (*) 0.50 - 1.10 mg/dL   Calcium 9.0  8.4 - 10.5 mg/dL   Total Protein 7.1  6.0 - 8.3 g/dL   Albumin 3.5  3.5 - 5.2 g/dL   AST 32  0 - 37 U/L   Comment: SLIGHT HEMOLYSIS     HEMOLYSIS AT THIS LEVEL MAY AFFECT RESULT   ALT 13  0 - 35 U/L   Comment: SLIGHT HEMOLYSIS     HEMOLYSIS AT THIS LEVEL MAY AFFECT RESULT   Alkaline Phosphatase 56  39 - 117 U/L   Total Bilirubin 0.4  0.3 - 1.2 mg/dL   GFR calc non Af Amer >90  >90 mL/min   GFR calc Af Amer >90  >90 mL/min   Comment: (NOTE)     The eGFR has been calculated using the CKD EPI equation.     This calculation has not been validated in all clinical situations.     eGFR's persistently <90 mL/min signify possible Chronic Kidney     Disease.  PROTIME-INR     Status: None   Collection Time    04/03/13 12:40 PM      Result Value Ref Range   Prothrombin Time 14.1  11.6 - 15.2 seconds   INR 1.11  0.00 - 7.51  BASIC METABOLIC PANEL     Status: Abnormal   Collection Time    04/04/13  4:10 AM      Result Value Ref Range   Sodium 137  137 - 147 mEq/L   Potassium 3.7  3.7 - 5.3 mEq/L   Chloride 109  96 - 112 mEq/L   CO2 11 (*) 19 - 32  mEq/L   Glucose, Bld 110 (*) 70 - 99 mg/dL   BUN 10  6 - 23 mg/dL   Creatinine, Ser 0.47 (*) 0.50 - 1.10 mg/dL   Calcium 9.1  8.4 - 10.5 mg/dL   GFR calc non Af Amer  >90  >90 mL/min   GFR calc Af Amer >90  >90 mL/min   Comment: (NOTE)     The eGFR has been calculated using the CKD EPI equation.     This calculation has not been validated in all clinical situations.     eGFR's persistently <90 mL/min signify possible Chronic Kidney     Disease.   Mr Jeri Cos Wo Contrast  04/03/2013   CLINICAL DATA:  Lung cancer staging, newly diagnosed hilar mass. Recent history of dysphagia.  EXAM: MRI HEAD WITHOUT AND WITH CONTRAST  TECHNIQUE: Multiplanar, multiecho pulse sequences of the brain and surrounding structures were obtained without and with intravenous contrast.  CONTRAST:  54m MULTIHANCE GADOBENATE DIMEGLUMINE 529 MG/ML IV SOLN  COMPARISON:  CT of the head of report dated September 15, 2002 though images are not available for direct comparison.  FINDINGS: There are multiple subtle sub cm foci of abnormal enhancement within the supratentorial brain, along the periventricular and a gray-white matter junction, some of the show minimal FLAIR and T2 hyperintense signal, though are poorly defined on the axial T1-1 postcontrast, this may reflect temporal changes as, contrast-enhanced sequences acquired at a slightly later phase better demonstrate the lesions. Lesions are as follows:  6 mm right periatrial white matter lesion, centrum semiovale 4 mm lesion (sagittal 12/21), left posterior frontal cortical 8 mm lesion (axial 34/52), left temporal 5 mm lesion (axial 19/52), high right parietal 4 mm lesion (sagittal 7/21), 2 right occipital 3-4 mm lesions (sagittal 5/21), possible left occipital 4 mm lesion (sagittal 16/21), possible right posterior frontal lesion measuring 7 mm (sagittal 8/21).  Left pontine capillary telangiectasias, left frontal developmental venous anomaly.  The ventricles and sulci are overall normal for patient's age. Patchy supratentorial white matter FLAIR T2 hyperintensities, exclusive of the aforementioned abnormalities may reflect chronic small vessel ischemic  disease. Tiny focus of reduced diffusion associated with the right periatrial lesion, without reduced diffusion to suggest acute ischemia. No midline shift or mass effect. No susceptibility artifact to suggest hemorrhage.  No abnormal extra-axial fluid collections or masses.  Ocular globes and orbital contents are nonsuspicious, status post bilateral ocular lens implants. Visualized paranasal sinuses and mastoid air cells are well aerated. Mild temporomandibular osteoarthrosis. No abnormal sellar expansion. No cerebellar tonsillar descent below the foramen magnum. Severe degenerative change of the included cervical spine.  IMPRESSION: At least 7 enhancing supratentorial lesions concerning for metastatic disease with additional possible left occipital right parietal lesions as described above. No midline shift or mass effect.  Moderate white matter changes suggest chronic small vessel ischemic disease. Mild brain atrophy.   Electronically Signed   By: CElon Alas  On: 04/03/2013 05:04   Ct Abdomen Pelvis W Contrast  04/03/2013   CLINICAL DATA:  Left-sided pain.  History of diverticulitis.  EXAM: CT ABDOMEN AND PELVIS WITH CONTRAST  TECHNIQUE: Multidetector CT imaging of the abdomen and pelvis was performed using the standard protocol following bolus administration of intravenous contrast.  CONTRAST:  856mOMNIPAQUE IOHEXOL 300 MG/ML  SOLN  COMPARISON:  Chest CT, 03/25/2013  FINDINGS: There are scattered diverticula along the left colon, mostly along the sigmoid colon, but no evidence of diverticulitis.  There is mild inflammatory type  change in the fat adjacent to the terminal ileum. What appears to be the appendix is closely approximated to the terminal ileum. It does not appear to be thickened.  Bowel is otherwise unremarkable.  Large right pleural effusion. Masslike consolidation is noted in the central right lower lobe. There multiple other pulmonary nodules. These findings are stable from the recent  chest CT.  Low-density lesion lies in the medial segment of the left lobe of the liver adjacent to the falciform ligament. It measures 15 mm. This may reflect focal fat. Liver is otherwise unremarkable. Normal spleen. The gallbladder is distended but otherwise unremarkable. No bile duct dilation. Normal pancreas. Mild thickening of the left adrenal gland suggesting hyperplasia. Normal right adrenal gland. Small low-density lesion arises from the upper pole of the right kidney, most likely a cyst. It is stable. Kidneys otherwise unremarkable. Normal ureters. The bladder is unremarkable.  2.1 cm hyper attenuating area in the upper uterine segment likely a fibroid. Uterus and adnexa are otherwise unremarkable.  No adenopathy.  No ascites.  There are marked degenerative changes as well as a scoliosis of the visible spine. No osteoblastic or osteolytic lesions.  IMPRESSION: 1. No evidence of diverticulitis. There are no findings to explain left lower quadrant pain. Patient does have left colon diverticula. 2. Mild inflammatory changes seen adjacent to the terminal ileum. The appendix is closely approximated to the terminal ileum not well defined. It does not appear thickened. These findings are nonspecific. An infectious or inflammatory ileitis possible. No other evidence of acute abdomen or pelvic process. 3. Masslike opacity in the central right lower lobe associated with a large right pleural effusion and multiple additional pulmonary nodules. This is unchanged from the recent prior chest CT. 4. 15 mm area of hypoattenuation in the medial segment of the left lobe of the liver most likely an area of focal fatty infiltration. Mild left adrenal thickening likely hyperplasia. There are significant degenerative changes throughout the visualized spine.   Electronically Signed   By: Lajean Manes M.D.   On: 04/03/2013 12:03    Assessment/Plan Dysphagia Metastatic lung cancer For G-tube placement Explained procedure,  risks, complications, use of sedation Labs reviewed Consent signed in chart  Ascencion Dike PA-C 04/04/2013, 9:13 AM

## 2013-04-04 NOTE — Consult Note (Signed)
Palliative Medicine Team at [facility]  Date: 04/04/2013   Patient Name: Beth Collier  DOB: [birth year]  MRN: [id number]  Age / Sex: 80 y.o., female   PCP: Colon Branch, MD Referring Physician: Venetia Maxon Rama, MD  HPI/Reason for Consultation: 80 yo with newly diagnosed adenocarcinoma of the lung, s/p bronchoscopy on 03/28/13. She presented with complaints of dysphagia and difficulty swallowing food on 03/21/13 and subsequently underwent an EGD which showed extrinsic compression of the esophagus now found to be from a very large mass in the right lower lobe of her lung. She was seen by oncology and radiation oncology and offered treatment with radiation, PEG tube and radiosurgery. Treatment options and care plan were discussed with presumably patient only and documentation indicated that she was "strongly in favor" of radiosurgery. Family arrived at bedside this afternoon and were very upset that the patient already had a PEG and was scheduled for radiation in AM without their knowledge. They are concerned that the patient (their mother) is intermittently confused, will "tell doctors what she thinks they want to hear", will be agreeable and really doesn't understand what this path will be like. Urgent Palliative consult was obtained for [place] to sit down with both patient, her son and daughter in law to sort out true and accurate goals of care-including exploring her families concerns, their disconnect and offer additional information on her medical situation.  Current status of her cancer/Problem List:  Large left lung mass  Large Right pleural effusion  Developing Post Obstructive PNA  Intermittently symptomatic esophageal obstruction from tumor compression. She is still able to swallow liquids and puree foods.  Bulky mediastainal adenopathy  Debility ECOG 3 (she tells me at present time she is unable to walk due to weakness-prior to admission ECOG probably 2)  7 brain lesions with some surounding  edema-family has noticied cognitive decline and gait instability.  Adrenal Hyperplasia  Inflammation of her terminal ileum  Metabolic acidosis  PEG tube placed today (new)  Participants in Discussion: Patient, her son Beth Collier, her daughter-in-lawRhonda  Goals/Summary of Discussion:  1. Code Status:  DNR  2. Scope of Treatment:   Very detailed conversation with patient and her family that started with an assessment of their understanding of her disease and expectations. Patient family were able to tell me very little about her condition and what information they had been given or understood other than the fact that she has lung cancer and there had been preliminary discussion about treatment options -family did not know she had brain metastasis, they did not know decisions had been made to proceed with PEG and Radiation. Patient tells me she had agreed to PEG and to go ahead with radiation but that she is now having hesitation-Patient told me she didn't understand everything she had been told- she openly confided that she "really doesn't want all the things done to her that the doctors are planning". She tells me she has lived a good and long life. She further adds that she is most worried about her loss of independence, being a burden on her family and that she just wants to be as well as possible for the time she has with minimal medical interventions- she really is telling me that she simply doesn't want to do anything else -she accepts her terminal condition and wants to be comfortable for her remaining time. She articulates clearly a preference for quality over quantity and is feeling that she doesn't really want to go through  the treatment because it may mean side effects-she may have to go to nursing home and she doesn't think she wants to go that road. She is afraid of suffering. This is all new for her-she is grieving. Her children are also grieving and afraid- they want to be actively  included in her care plan and decisions moving forward.  She has a fairly high symptom burden currently- she is having some evening delirium, persistent nausea, left lower quadrant pain, dyspnea with minimal exertion, orthopnea  Recommendations:  1. DNR order placed, discussed in detail. 2. PEG at this point should not be used for tube feeding- especially continuous tube feeding. There may be a role for intermittent supplemental feeding for comfort. I discussed the use of the PEG tube for the administration of essential medications including comfort meds when needed. Allow for PO as tolerated-she can take thin liquids and puree food well according to patient and her family-dysphagia is intermittently severe. 3. Cancel Radiation treatment in the AM-I have requested that the patient and family take some time to think about their options tonight. We will re-meet tomorrow after allowing for some consideration time. 4. I did offer the option of FULL COMFORT care including residential hospice care which they are very seriously considering as an option at this point. After reviewing her overall big picture I would certainly support that based on her stated goals of care and preferences- I anticipate that her effusion and the Post obstructive PNA brewing may be major issues before radiation will have time to make a meaningful improvement. She is also quite debilitated and weak from my assessment today. Family may be interested in a referral to [facility]. 5. Alprazolam for sleep-has tolerated this well in the past.  Family report feeling much more reassured and are clear on their choices and options-they expressed gratitude for the palliative consultation.  Will re-meet tomorrow and discuss whether we need to re-direct back to rad onc or pursue hospice care.   Time: 70 minutes. Greater than 50%  of this time was spent counseling and coordinating care related to the above assessment and  plan.  Signed by: Acquanetta Chain, DO  04/04/2013, 11:50 PM  Please contact Palliative Medicine Team phone at [phone] for questions and concerns.

## 2013-04-04 NOTE — Progress Notes (Signed)
NUTRITION FOLLOW UP/TF Consult  Intervention:   -Initiate Osmolite 1.2 @ 20 ml/hr via PEG tube and increase by 10 ml every 4 hours to goal rate of 50 ml/hr. At goal rate, tube feeding regimen will provide 1440 kcal, 67 grams of protein, and 984 ml of H2O.  -If bolus feedings warranted upon d/c, consider Osmolite 1.2 at goal rate of 5 can/daily to provide 1422 kcal, 66 gram protein, 903 ml of H2O   Nutrition Dx:   Inadequate oral intake related to difficulty swallowing as evidenced by PO intake <75% for > 3 weeks   Goal:   TF to meet >/= 90% of their estimated nutrition needs    Monitor:   TF tolerance, total protein/energy intake, swallow profile, labs, weights  Assessment:   2/09: -Pt reported poor PO intake for past 3 weeks d/t difficulty swallowing.  -Has tried soft foods, such as oatmeal and applesauce but is unable to keep it down  -Tolerates thickened liquids. Pt's family would provide her with fruit smoothies that pt was able to tolerate until Sunday (2/08) d/t dysphagia and nausea  -Pt has tried Ensure shakes, but has difficulty with lactose products and d/c supplement  -Usual weight around 110 lbs. Has experienced a 18 lbs weight loss over 3 week period  -MD noted pt w/dysphagia likely r/t lung mass. Has had esophageal dilation.SLP and GI consult pending  -Pt was open to trying additional supplements pending SLP recommendations  2/11: -SLP evaluation cancelled d/t GI referral, will be reordered if indicated -Pt agreed for PEG tube placement a -Was on Full liquid diet on 2/11. 0% PO intake -Received consult for TF initiation and management    Height: Ht Readings from Last 1 Encounters:  03/31/13 4' 11.6" (1.514 m)    Weight Status:   Wt Readings from Last 1 Encounters:  04/02/13 92 lb (41.731 kg)    Re-estimated needs:  Kcal: 1250-1450  Protein: 55-65 gram  Fluid: >/=1400 ml/daily   Skin: WDL  Diet Order: NPO   Intake/Output Summary (Last 24 hours) at  04/04/13 1121 Last data filed at 04/04/13 1003  Gross per 24 hour  Intake   1365 ml  Output    200 ml  Net   1165 ml    Last BM: PTA   Labs:   Recent Labs Lab 04/01/13 0324 04/02/13 0350 04/03/13 0345 04/04/13 0410  NA 137 133* 136* 137  K 3.1* 3.9 3.4* 3.7  CL 98 99 102 109  CO2 19 13* 11* 11*  BUN 12 7 7 10   CREATININE 0.55 0.44* 0.46* 0.47*  CALCIUM 8.5 8.6 9.0 9.1  MG  --  1.9  --   --   PHOS  --  2.4  --   --   GLUCOSE 79 81 93 110*    CBG (last 3)  No results found for this basename: GLUCAP,  in the last 72 hours  Scheduled Meds: . hydrALAZINE  10 mg Intravenous 3 times per day  . vancomycin  1,000 mg Intravenous Once    Continuous Infusions: .  sodium bicarbonate  infusion 1000 mL 75 mL/hr at 04/04/13 [place] Clinical Dietitian [phone]

## 2013-04-04 NOTE — Telephone Encounter (Signed)
Beth Collier called   For patient  In 1307, family is refusing  radiation treatment  Today, family feels patient isn't coherent to even sign consent for treatment, and would like to speak with MD first, wil call her back after speaking with Dr.Moody, He is in with a follow up patient, called Beth Collier back and MD requesting that family come to Rad/Onc, and he will discuss treatment with the family, can we copme get patient and the family?"no, per Beth Collier she will send family only down, will inform MD 4:18 PM Shawnie Dapper spoke with Beth Collier and was informed family is with palliative care at [facility] and will be there for a while, will call back if family wishes to speak with MD 4:19 PM

## 2013-04-05 ENCOUNTER — Ambulatory Visit: Payer: Medicare Other

## 2013-04-05 DIAGNOSIS — Z515 Encounter for palliative care: Secondary | ICD-10-CM

## 2013-04-05 DIAGNOSIS — Z66 Do not resuscitate: Secondary | ICD-10-CM | POA: Diagnosis not present

## 2013-04-05 DIAGNOSIS — R634 Abnormal weight loss: Secondary | ICD-10-CM

## 2013-04-05 LAB — GLUCOSE, CAPILLARY
GLUCOSE-CAPILLARY: 148 mg/dL — AB (ref 70–99)
Glucose-Capillary: 146 mg/dL — ABNORMAL HIGH (ref 70–99)

## 2013-04-05 MED ORDER — BOOST / RESOURCE BREEZE PO LIQD
1.0000 | Freq: Two times a day (BID) | ORAL | Status: DC
  Administered 2013-04-05: 1 via ORAL

## 2013-04-05 NOTE — Progress Notes (Signed)
PROGRESS NOTE   Beth Collier EXH:[id number] DOB: 1931/01/26 DOA: 03/31/2013 PCP: Kathlene November, MD  Brief narrative: Beth Collier is an 80 y.o. female with a PMH of recently diagnosed adenocarcinoma of the lung, status post bronchoscopy 03/28/13 as well as recent evaluation for dysphasia status post esophageal dilatation with endoscopy 03/21/13 with dysphasia determined to be secondary to extrinsic compression of the esophagus from lung mass, who was admitted on 03/31/13 with worsening dysphasia and vomiting. She had a PEG tube placed 04/04/13 and a palliative care meeting was held with the patient and her family after her family expressed concerns that treatment options had not fully been presented to them.  Assessment/Plan: Principal Problem:   Acquired dysphasia secondary to extrinsic compression of the esophagus from lung mass associated with nausea and vomiting / dehydration The patient was admitted and placed on bowel rest with IV fluid hydration. Oncology/radiation oncology consultation was requested for further treatment recommendations, since her dysphasia was from extrinsic compression of the esophagus from her lung mass. PEG tube placed 04/04/13 after a full discussion with the patient about this 04/03/2013 (patient indicated she wanted to proceed and felt to be competent to make this decision, although the family disagreed and felt that the tube should not have been placed without discussing it with them first). Was scheduled to undergo radiation simulation and initiation of radiation treatment 03/04/13 however family requested to cancel the planned treatment. Emergent palliative care consultation was requested to clarify goals of care with the patient and her family, and Dr. Delanna Ahmadi help greatly appreciated in helping this family make medical decisions. At this point, both the patient and her family tell me along with Dr. Hilma Favors that she does not want any further treatment for her malignancy and  her goals are "comfort "and "peace ". Additionally, the patient stated she was "relieved "that she did not have to undergo treatment. At this point, the patient is tolerating liquids and pured foods and the plan is to discharge her to a residential hospice facility for end-of-life care. Active Problems:   Metabolic acidosis Likely from starvation ketosis. Serum lactate within normal limits. No further lab draws.   Adenocarcinoma of lung, stage 4 with brain metastasis See Dr. Worthy Flank consult note for a discussion of current treatment options presented to the patient. At this time, the family and the patient do not want further treatment after a full discussion of treatment options were discussed with the patient status post palliative care consultation, kindly done by Dr. Hilma Favors 04/04/13 to clarify goals of care.    Pleural effusion Status post BAL 03/29/13, cultures negative to date. Pulmonology evaluated the patient on 04/01/13. Recommended right thoracentesis depending on course and symptoms if needed.   Hypokalemia Resolved with potassium added to IV fluids.   Protein-calorie malnutrition, severe PEG tube placed 04/03/13. Hold tube feeds based on goals of care.   DVT Prophylaxis Continue Lovenox.  Code Status: Full. Family Communication: Son and daughter-in-law updated at bedside. Disposition Plan: Residential hospice.   IV access:  Peripheral IV  Medical Consultants:  Dr. Curt Bears, Oncology  Dr. Marye Round, Radiation oncology  Dr. Brand Males, Pulmonology  Dr. Rhea Pink, Palliative Care.  Other Consultants:  Dietitian  Anti-infectives:  None  HPI/Subjective: Beth Collier has some mild tenderness at the PEG tube site. No current complaints of dyspnea or cough. She is weak and has had a bit of word finding difficulty today. Tolerating liquids.  Objective: Filed Vitals:  04/04/13 2057 04/05/13 0500 04/05/13 0505 04/05/13 0530  BP: 109/49  139/71    Pulse: 104  114 103  Temp: 98.8 F (37.1 C)  98.7 F (37.1 C)   TempSrc: Oral  Oral   Resp: 18  13   Height:      Weight:  43.636 kg (96 lb 3.2 oz)    SpO2: 98%  97%     Intake/Output Summary (Last 24 hours) at 04/05/13 0746 Last data filed at 04/04/13 1745  Gross per 24 hour  Intake      0 ml  Output    450 ml  Net   -450 ml    Exam: Gen:  NAD Cardiovascular:  Tachycardic, No M/R/G Respiratory:  Lungs CTAB, but diminished Gastrointestinal:  Abdomen soft, NT/ND, + BS, PEG tube left upper quadrant Extremities:  No C/E/C  Data Reviewed: Basic Metabolic Panel:  Recent Labs Lab 03/31/13 1345 04/01/13 0324 04/02/13 0350 04/03/13 0345 04/04/13 0410  NA 135* 137 133* 136* 137  K 3.8 3.1* 3.9 3.4* 3.7  CL 94* 98 99 102 109  CO2 21 19 13* 11* 11*  GLUCOSE 91 79 81 93 110*  BUN 11 12 7 7 10   CREATININE 0.60 0.55 0.44* 0.46* 0.47*  CALCIUM 9.2 8.5 8.6 9.0 9.1  MG  --   --  1.9  --   --   PHOS  --   --  2.4  --   --    GFR Estimated Creatinine Clearance: 38 ml/min (by C-G formula based on Cr of 0.47). Liver Function Tests:  Recent Labs Lab 04/01/13 0324 04/02/13 0350 04/03/13 0345  AST 18 23 32  ALT 9 12 13   ALKPHOS 56 58 56  BILITOT 0.5 0.6 0.4  PROT 6.6 6.8 7.1  ALBUMIN 3.3* 3.5 3.5   CBC:  Recent Labs Lab 03/31/13 1555  WBC 5.0  NEUTROABS 3.2  HGB 13.8  HCT 40.3  MCV 92.0  PLT 316   Cardiac Enzymes:  Recent Labs Lab 03/31/13 1345  TROPONINI <0.30   Microbiology Recent Results (from the past 240 hour(s))  AFB CULTURE WITH SMEAR     Status: None   Collection Time    03/29/13  8:33 AM      Result Value Ref Range Status   Specimen Description BRONCHIAL ALVEOLAR LAVAGE   Final   Special Requests Normal   Final   ACID FAST SMEAR     Final   Value: NO ACID FAST BACILLI SEEN     Performed at [facility]   Culture     Final   Value: CULTURE WILL BE EXAMINED FOR 6 WEEKS BEFORE ISSUING A FINAL REPORT     Performed at [facility]   Report Status PENDING   Incomplete  FUNGUS CULTURE W SMEAR     Status: None   Collection Time    03/29/13  8:33 AM      Result Value Ref Range Status   Specimen Description BRONCHIAL ALVEOLAR LAVAGE   Final   Special Requests Normal   Final   Fungal Smear     Final   Value: NO YEAST OR FUNGAL ELEMENTS SEEN     Performed at [facility]   Culture     Final   Value: CULTURE IN PROGRESS FOR FOUR WEEKS     Performed at [facility]   Report Status PENDING   Incomplete  URINE CULTURE     Status: None  Collection Time    03/31/13  3:10 PM      Result Value Ref Range Status   Specimen Description URINE, CATHETERIZED   Final   Special Requests NONE   Final   Culture  Setup Time     Final   Value: 03/31/2013 22:28     Performed at [facility]     Final   Value: NO GROWTH     Performed at [facility]   Culture     Final   Value: NO GROWTH     Performed at [facility]   Report Status 04/01/2013 FINAL   Final     Procedures and Diagnostic Studies: Dg Chest 2 View  03/31/2013   CLINICAL DATA:  Shortness of breath  EXAM: CHEST  2 VIEW  COMPARISON:  03/13/2013  FINDINGS: Cardiac shadow is stable. A scoliosis is again identified of the thoracolumbar spine. Increased density is again noted in the right lower lobe similar to that seen on the recent chest CT examination consistent with underlying mass and postobstructive change. A right-sided effusion is noted.  IMPRESSION: Persistent right lower lobe mass lesion with associated effusion and obstructive pneumonia.   Electronically Signed   By: Inez Catalina M.D.   On: 03/31/2013 14:27    Scheduled Meds: . hydrALAZINE  10 mg Intravenous 3 times per day   Continuous Infusions: . feeding supplement (OSMOLITE 1.2 CAL)    .  sodium bicarbonate  infusion 1000 mL 75 mL/hr at 04/05/13 0320    Time spent: 35 minutes with > 50% of time discussing current diagnostic test results,  clinical impression and plan of care with the patient's family.    LOS: 5 days   [facility] Pager [phone]. If unable to reach me by pager, please call my cell phone at [phone].  *Please note that the hospitalists switch teams on Wednesdays. Please call the flow manager at [phone] if you are having difficulty reaching the hospitalist taking care of this patient as she can update you and provide the most up-to-date pager number of provider caring for the patient. If 8PM-8AM, please contact night-coverage at [email or web address], password [facility]  04/05/2013, 7:46 AM    **Disclaimer: This note was dictated with voice recognition software. Similar sounding words can inadvertently be transcribed and this note may contain transcription errors which may not have been corrected upon publication of note.**

## 2013-04-05 NOTE — Progress Notes (Signed)
This RN spoke to [facility] nurse liason Robin, concerning admission assessment. Patient does not meet general inpatient (symptom management) criteria and per Robin no residential beds available. Family is agreeable to look at other hospice house options. Will pass onto oncoming RN to have weekend social worker follow up in AM.

## 2013-04-05 NOTE — Progress Notes (Signed)
Clinical Social Work Department BRIEF PSYCHOSOCIAL ASSESSMENT 04/05/2013  Patient:  Beth Collier, Beth Collier     Account Number:  [id number]     Admit date:  03/31/2013  Clinical Social Worker:  Ulyess Blossom  Date/Time:  04/05/2013 11:00 AM  Referred by:  Physician  Date Referred:  04/05/2013 Referred for  Residential hospice placement   Other Referral:   Interview type:  Patient Other interview type:   and patient family at bedside    PSYCHOSOCIAL DATA Living Status:  ALONE Admitted from facility:   Level of care:   Primary support name:  Darryl Haroon/son/[phone] Primary support relationship to patient:  CHILD, ADULT Degree of support available:   strong    CURRENT CONCERNS Current Concerns  Post-Acute Placement   Other Concerns:    SOCIAL WORK ASSESSMENT / PLAN CSW received notification from attending MD and PMT MD that goals of care meeting held with family yesterday and follow up today and pt and pt family interested in residential hospice placement.    CSW met with pt, pt son, and pt daughter-in-law at bedside. CSW introduced self and explained role. CSW discussed referral received from MD's regarding residential hospice placement. Pt and pt family were anticipating CSW visit. CSW discussed process of residential hospice placement. CSW provided support and clarified questions.    CSW provided residential hospice list to pt and pt family. Pt and pt family first choice is [facility]. Pt family discussed that if secondary options were needed then [facility] would be second choice and [facility] would be third choice.    CSW contacted [facility] and faxed referral to facility. CSW to await response regarding bed availability before expanding to secondary options.    CSW to follow up with pt and pt family regarding bed availability at residential hospice and assist with disposition needs when bed available and pt  medically stable for transfer.   Assessment/plan status:  Psychosocial Support/Ongoing Assessment of Needs Other assessment/ plan:   discharge planning   Information/referral to community resources:   Residential hospice list    PATIENT'S/FAMILY'S RESPONSE TO PLAN OF CARE: Pt alert and oriented x 4. Pt pleasant and pt family appear supportive and actively involved in pt care. Pt and pt family appear to be coping appropriately with plans to transition to residential hospice and pt and pt family expressed appreciation for CSW support and assistance with residential hospice placement.     Alison Murray, MSW, [place] Work [phone]

## 2013-04-05 NOTE — Progress Notes (Signed)
I met with the patient yesterday after consultation with other services. I had been informed that the patient's family had decided to cancel radiation treatment. I met with the patient and her son to discuss the patient's care and to answer any questions which they may have. The patient's son indicated that they were withdrawing all care including radiation treatment and chemotherapy. He expressed frustration that the advantages and in particular, the disadvantages of different treatments had not been discussed with the patient. I met with the patient on multiple occasions to review such information, and informed the patient's son to that effect. I was happy to have any such discussions with him as well. The patient's family in fact was scheduled to come to the radiation clinic prior to any treatment to have a discussion about her care and answer any questions. This however was canceled.  I discussed with the patient and her son that patients always have multiple options to guide their care, including proceeding with no treatment. I also discussed with them that I prefer families to have an understanding of the pros and cons of different options before making a final decision. The patient's son did not demonstrate any meaningful understanding of the clinical issues at hand in my opinion, either from a lack of knowledge or an unwillingness to have this conversation. Multiple questions regarding treatment options, including radiation treatment and systemic treatment, as well as the prognosis in different scenarios and natural history of the disease were not answered in any meaningful way.   With regards to her radiation treatment planning, the patient was scheduled to begin radiation treatment on 04/04/2013, not 04/05/13. This was to involve palliative radiation treatment to the patient's lung tumor. The timing of this treatment was at the patient's request. I previously discussed with her that this was not an  urgent issue but rather she asked for radiation treatment to begin "as soon as possible" to minimize the number of outpatient visits. The patient's informed consent for this treatment is documented in the electronic medical record. With regards to cranial radiation, which addresses a separate issue, the patient expressed a strong preference to proceed with radiosurgery rather than whole brain radiation treatment. I discussed with the patient that she appears eligible for such treatment based on initial information, but that this can change with further treatment planning. This option is especially important in many cases as this provides superior treatment in terms of efficacy, side effects/toxicity, and convenience. Most patients with such treatment have no significant side effects, especially for brain metastases the size of the patient's. At her request, this was going to be planned on an outpatient basis and we were going to further discuss this treatment option after she had more time to digest information.  At this time, it is unclear to me that the family has an understanding that untreated brain metastases at first diagnosis is an uncommon and typically unpleasant path. I would be more comfortable with this family decision if there was a willingness to discuss this issue before a final decision is made.   At the end of my conversation, the family reiterated that they had made a decision not to proceed with any treatment. They planned to finalize this decision on Friday. They also wanted no further testing with regards to possible chemotherapy. I believe this is unfortunate and premature - current staging studies do not indicate widely metastatic disease extracranially, and molecular testing had been ordered by medical oncology which can have a dramatic impact  on treatment options. I am hopeful that the family will be less antagonistic with Dr. Julien Nordmann in discussing these issues. I believe it is  unfortunate that the patient's family seems to be shutting out discussions with various care team members. Appropriate, individualized goals of care can be significantly impacted upon treatment options, and different specialties can be indispensible in providing information and eliminating misconceptions. I hope that the patient's family is amenable to such discussions. I would be happy to discuss issues surrounding radiation treatment with the broader family if this is something that they are willing to do. As we left it, I will followup if requested.

## 2013-04-05 NOTE — Progress Notes (Signed)
NUTRITION FOLLOW UP  Intervention:   Recommend pt's diet be downgraded to Dys1 diet to ensure pt receives only pureed foods (Dys 3 = chopped) Recommend Resource Breeze BID Provided pt and pt's family with Dys1 sample menu to assist in meal ordering Will continue to monitor   Nutrition Dx:   Inadequate oral intake related to difficulty swallowing as evidenced by PO intake <75% for > 3 weeks   Goal:   TF to meet >/= 90% of their estimated nutrition needs    Monitor:   TF tolerance, total protein/energy intake, swallow profile, labs, weights  Assessment:   2/09: -Pt reported poor PO intake for past 3 weeks d/t difficulty swallowing.  -Has tried soft foods, such as oatmeal and applesauce but is unable to keep it down  -Tolerates thickened liquids. Pt's family would provide her with fruit smoothies that pt was able to tolerate until Sunday (2/08) d/t dysphagia and nausea  -Pt has tried Ensure shakes, but has difficulty with lactose products and d/c supplement  -Usual weight around 110 lbs. Has experienced a 18 lbs weight loss over 3 week period  -MD noted pt w/dysphagia likely r/t lung mass. Has had esophageal dilation.SLP and GI consult pending  -Pt was open to trying additional supplements pending SLP recommendations  2/11: -SLP evaluation cancelled d/t GI referral, will be reordered if indicated -Pt agreed for PEG tube placement a -Was on Full liquid diet on 2/11. 0% PO intake -Received consult for TF initiation and management  2/13: -Pt's plan of care has changed after palliative care meeting -Family requesting PEG only be used for medications and supplement nutrition as warranted -Osmolite 1.2 ordered has been d/c'd. Will d'c Adult Enteral protocols -MD ordered Dys3 pureed food diet. Would benefit from downgrading to Dys1 as Dys 3 more appropriate for pt able to tolerate chopped foods -Pt willing to try Resource Breeze supplement -Pt's family had questions regarding puree  diet texture. Provided them with family with all foods available with current diet texture restrictions. Family appreciated assistance and agreed to encourage pt's PO intake of meals and supplement -Pt ate 10% of grits this morning   Height: Ht Readings from Last 1 Encounters:  03/31/13 4' 11.6" (1.514 m)    Weight Status:   Wt Readings from Last 1 Encounters:  04/05/13 96 lb 3.2 oz (43.636 kg)    Re-estimated needs:  Kcal: 1250-1450  Protein: 55-65 gram  Fluid: >/=1400 ml/daily   Skin: WDL  Diet Order: Dysphagia   Intake/Output Summary (Last 24 hours) at 04/05/13 1307 Last data filed at 04/05/13 8119  Gross per 24 hour  Intake    120 ml  Output    500 ml  Net   -380 ml    Last BM: PTA   Labs:   Recent Labs Lab 04/01/13 0324 04/02/13 0350 04/03/13 0345 04/04/13 0410  NA 137 133* 136* 137  K 3.1* 3.9 3.4* 3.7  CL 98 99 102 109  CO2 19 13* 11* 11*  BUN 12 7 7 10   CREATININE 0.55 0.44* 0.46* 0.47*  CALCIUM 8.5 8.6 9.0 9.1  MG  --  1.9  --   --   PHOS  --  2.4  --   --   GLUCOSE 79 81 93 110*    CBG (last 3)   Recent Labs  04/04/13 2355 04/05/13 0541  GLUCAP 148* 146*    Scheduled Meds: . feeding supplement (RESOURCE BREEZE)  1 Container Oral BID WC  . hydrALAZINE  10 mg Intravenous 3 times per day    Continuous Infusions: .  sodium bicarbonate  infusion 1000 mL 75 mL/hr at 04/05/13 [place] LDN Clinical Dietitian [phone]

## 2013-04-05 NOTE — Progress Notes (Addendum)
Palliative Medicine Team Progress Note  Patient seen and examined this AM. Family have clearly given deep and meaningful consideration to their options including treatment options and the option for full comfort. This morning she appears even weaker-worsening issues with back pain, she is more dyspneic, having issues with word finding. Her son and daughter in law are at bedside. The patient tells me "I want peace". She says she feels "relieved"- she reports having a visitor this morning who was an old friend and she told her that she was going to get to go before her and they talked about how dying is just the next step. I am confident this patient and also her family have been adequately informed of choices. Based on her stated preferences and personal beliefs, the option for full comfort and hospice care was offered yesterday as a reasonable and acceptable path. Dr. Rockne Menghini was present in room during our discussion this morning and is in full agreement with helping this patient transition to a hospice facility for EOL care- I have reassured patient that she will be comfortable and this will not be unpleasant without treatment.   Primary Diagnosis: Adenocarcinoma of the Lung, stage 4, brain mets, effusion, esophageal compression  Active Symptoms: 1. Back Pain 2. Delirium 3. Immobility 4. Dysphagia  Prognosis: <3 weeks.  Disposition: Hospice Facility-family request [place]  Time: 35 minutes. Greater than 50%  of this time was spent counseling and coordinating care related to the above assessment and plan.   Lane Hacker, DO Palliative Medicine

## 2013-04-06 DIAGNOSIS — Z515 Encounter for palliative care: Secondary | ICD-10-CM

## 2013-04-06 NOTE — Progress Notes (Signed)
No beds a [facility], per [employer].  Pt's son made aware.  Bernita Raisin, [place] Work [phone]

## 2013-04-06 NOTE — Progress Notes (Addendum)
Received a message from [facility], stating that a call was received from [facility] stating that they cannot accept Pt.  Chart review.  Noted that family's 2nd choice is [facility].  Referral made to [facility] and [facility].  Pt's son made aware and is grateful.  Bernita Raisin, [place] Work [phone]

## 2013-04-06 NOTE — Progress Notes (Signed)
PT Cancellation Note  Patient Details Name: Beth Collier MRN: [id number] DOB: 09-06-1930   Cancelled Treatment:    Reason Eval/Treat Not Completed: Other (comment) (cx today per fam; pt is amb with nsg to [facility], ck back after they meet with [facility], fam unsure if they will be able to get a bed and may have to pursue other D/C options)   [facility] 04/06/2013, 11:03 AM

## 2013-04-06 NOTE — Progress Notes (Signed)
Chart reviewed. This patient is most appropriate for [facility] level Hospice Care for symptom management of nausea (she has tumor obstructing her esophagus and probably associated vagal stimulation). Additionally she has acute on chronic low back pain from immobility. She has significant dyspnea on exertion. Prognostically 3 weeks is probably being overly optimistic- I doubt she will die from her dysphagia but more likely from the lung tumor causing a significant PNA or encroachment into major vessels. I will evaluate her this afternoon please do not hesitate to contact me this afternoon for specific questions related to her prognosis and comfort care.   Lane Hacker, DO Palliative Medicine  [phone]

## 2013-04-06 NOTE — Progress Notes (Signed)
PROGRESS NOTE   Beth Collier YYT:[id number] DOB: 1930-08-20 DOA: 03/31/2013 PCP: Kathlene November, MD  Brief narrative: Beth Collier is an 80 y.o. female with a PMH of recently diagnosed adenocarcinoma of the lung, status post bronchoscopy 03/28/13 as well as recent evaluation for dysphasia status post esophageal dilatation with endoscopy 03/21/13 with dysphasia determined to be secondary to extrinsic compression of the esophagus from lung mass, who was admitted on 03/31/13 with worsening dysphasia and vomiting. PEG tube placed 04/04/13 after a full discussion with the patient about this 04/03/2013 (patient indicated she wanted to proceed and felt to be competent to make this decision, although the family disagreed and felt that the tube should not have been placed without discussing it with them first). Was scheduled to undergo radiation simulation and initiation of radiation treatment 03/04/13 however family requested to cancel the planned treatment. Emergent palliative care consultation was requested to clarify goals of care with the patient and her family.  At this point, both the patient and her family tell me along with Dr. Hilma Favors that she does not want any further treatment for her malignancy and her goals are "comfort "and "peace ".  Assessment/Plan: Principal Problem:   Acquired dysphasia secondary to extrinsic compression of the esophagus from lung mass associated with nausea and vomiting / dehydration Continue dysphagia 1 diet and thin liquids.  Seeking residential hospice placement.  Active Problems:   Metabolic acidosis Likely from starvation ketosis. Serum lactate within normal limits. No further lab draws.   Adenocarcinoma of lung, stage 4 with brain metastasis See Dr. Worthy Flank consult note for a discussion of current treatment options presented to the patient. At this time, the family and the patient do not want further treatment after a full discussion of treatment options were discussed  with the patient status post palliative care consultation, kindly done by Dr. Hilma Favors 04/04/13 to clarify goals of care.    Pleural effusion Status post BAL 03/29/13, cultures negative to date. Pulmonology evaluated the patient on 04/01/13. Recommended right thoracentesis depending on course and symptoms if needed.   Hypokalemia Resolved with potassium added to IV fluids. No further lab draws.   Protein-calorie malnutrition, severe PEG tube placed 04/03/13. Hold tube feeds based on goals of care.   DVT Prophylaxis Lovenox discontinued per goals of care for comfort.  Code Status: Full. Family Communication: Son and daughter-in-law updated at bedside. Disposition Plan: Residential hospice.   IV access:  Peripheral IV  Medical Consultants:  Dr. Curt Bears, Oncology  Dr. Marye Round, Radiation oncology  Dr. Brand Males, Pulmonology  Dr. Rhea Pink, Palliative Care.  Other Consultants:  Dietitian  Anti-infectives:  None  HPI/Subjective: Beth Collier has some mild tenderness at the PEG tube site. No current complaints of dyspnea or cough. She continues to be weak with poor oral intake. States she had a small bowel movement this morning.  Objective: Filed Vitals:   04/05/13 1437 04/05/13 2112 04/05/13 2217 04/06/13 0507  BP: 100/40 123/66 128/68 129/80  Pulse: 120 109  102  Temp: 99.9 F (37.7 C) 98.3 F (36.8 C)  98 F (36.7 C)  TempSrc: Oral Oral  Oral  Resp: 16 14    Height:      Weight:      SpO2: 94% 91%  96%    Intake/Output Summary (Last 24 hours) at 04/06/13 0755 Last data filed at 04/06/13 0600  Gross per 24 hour  Intake    340 ml  Output  750 ml  Net   -410 ml    Exam: Gen:  NAD Cardiovascular:  Tachycardic, No M/R/G Respiratory:  Lungs CTAB, but diminished Gastrointestinal:  Abdomen soft, minimally tender at PEG tube insertion site, + BS, PEG tube left upper quadrant Extremities:  No C/E/C  Data Reviewed: Basic Metabolic  Panel:  Recent Labs Lab 03/31/13 1345 04/01/13 0324 04/02/13 0350 04/03/13 0345 04/04/13 0410  NA 135* 137 133* 136* 137  K 3.8 3.1* 3.9 3.4* 3.7  CL 94* 98 99 102 109  CO2 21 19 13* 11* 11*  GLUCOSE 91 79 81 93 110*  BUN 11 12 7 7 10   CREATININE 0.60 0.55 0.44* 0.46* 0.47*  CALCIUM 9.2 8.5 8.6 9.0 9.1  MG  --   --  1.9  --   --   PHOS  --   --  2.4  --   --    GFR Estimated Creatinine Clearance: 38 ml/min (by C-G formula based on Cr of 0.47). Liver Function Tests:  Recent Labs Lab 04/01/13 0324 04/02/13 0350 04/03/13 0345  AST 18 23 32  ALT 9 12 13   ALKPHOS 56 58 56  BILITOT 0.5 0.6 0.4  PROT 6.6 6.8 7.1  ALBUMIN 3.3* 3.5 3.5   CBC:  Recent Labs Lab 03/31/13 1555  WBC 5.0  NEUTROABS 3.2  HGB 13.8  HCT 40.3  MCV 92.0  PLT 316   Cardiac Enzymes:  Recent Labs Lab 03/31/13 1345  TROPONINI <0.30   Microbiology Recent Results (from the past 240 hour(s))  AFB CULTURE WITH SMEAR     Status: None   Collection Time    03/29/13  8:33 AM      Result Value Ref Range Status   Specimen Description BRONCHIAL ALVEOLAR LAVAGE   Final   Special Requests Normal   Final   ACID FAST SMEAR     Final   Value: NO ACID FAST BACILLI SEEN     Performed at [facility]   Culture     Final   Value: CULTURE WILL BE EXAMINED FOR 6 WEEKS BEFORE ISSUING A FINAL REPORT     Performed at [facility]   Report Status PENDING   Incomplete  FUNGUS CULTURE W SMEAR     Status: None   Collection Time    03/29/13  8:33 AM      Result Value Ref Range Status   Specimen Description BRONCHIAL ALVEOLAR LAVAGE   Final   Special Requests Normal   Final   Fungal Smear     Final   Value: NO YEAST OR FUNGAL ELEMENTS SEEN     Performed at [facility]   Culture     Final   Value: CULTURE IN PROGRESS FOR FOUR WEEKS     Performed at [facility]   Report Status PENDING   Incomplete  URINE CULTURE     Status: None   Collection Time    03/31/13  3:10 PM       Result Value Ref Range Status   Specimen Description URINE, CATHETERIZED   Final   Special Requests NONE   Final   Culture  Setup Time     Final   Value: 03/31/2013 22:28     Performed at [facility]     Final   Value: NO GROWTH     Performed at [facility]   Culture     Final   Value: NO  GROWTH     Performed at [facility]   Report Status 04/01/2013 FINAL   Final     Procedures and Diagnostic Studies: Dg Chest 2 View  03/31/2013   CLINICAL DATA:  Shortness of breath  EXAM: CHEST  2 VIEW  COMPARISON:  03/13/2013  FINDINGS: Cardiac shadow is stable. A scoliosis is again identified of the thoracolumbar spine. Increased density is again noted in the right lower lobe similar to that seen on the recent chest CT examination consistent with underlying mass and postobstructive change. A right-sided effusion is noted.  IMPRESSION: Persistent right lower lobe mass lesion with associated effusion and obstructive pneumonia.   Electronically Signed   By: Inez Catalina M.D.   On: 03/31/2013 14:27    Scheduled Meds: . feeding supplement (RESOURCE BREEZE)  1 Container Oral BID WC  . hydrALAZINE  10 mg Intravenous 3 times per day   Continuous Infusions: .  sodium bicarbonate  infusion 1000 mL 75 mL/hr at 04/05/13 2217    Time spent: 25 minutes.    LOS: 6 days   [facility] Pager [phone]. If unable to reach me by pager, please call my cell phone at [phone].  *Please note that the hospitalists switch teams on Wednesdays. Please call the flow manager at [phone] if you are having difficulty reaching the hospitalist taking care of this patient as she can update you and provide the most up-to-date pager number of provider caring for the patient. If 8PM-8AM, please contact night-coverage at [email or web address], password [facility]  04/06/2013, 7:55 AM    **Disclaimer: This note was dictated with voice recognition software. Similar sounding  words can inadvertently be transcribed and this note may contain transcription errors which may not have been corrected upon publication of note.**

## 2013-04-07 MED ORDER — LORAZEPAM 2 MG/ML PO CONC: 1.0000 mg | ORAL | Status: AC | PRN

## 2013-04-07 MED ORDER — PROMETHAZINE HCL 25 MG/ML IJ SOLN
12.5000 mg | INTRAMUSCULAR | Status: DC | PRN
  Administered 2013-04-07: 12.5 mg via INTRAVENOUS
  Filled 2013-04-07: qty 1

## 2013-04-07 MED ORDER — HYDROMORPHONE HCL PF 1 MG/ML IJ SOLN
0.5000 mg | INTRAMUSCULAR | Status: DC | PRN
  Administered 2013-04-07: 0.5 mg via INTRAVENOUS
  Filled 2013-04-07: qty 1

## 2013-04-07 MED ORDER — ONDANSETRON HCL 4 MG/2ML IJ SOLN: 4.0000 mg | INTRAMUSCULAR | Status: AC | PRN

## 2013-04-07 MED ORDER — METOCLOPRAMIDE HCL 5 MG/ML IJ SOLN: 5.0000 mg | Freq: Three times a day (TID) | INTRAMUSCULAR | Status: AC | PRN

## 2013-04-07 MED ORDER — PROMETHAZINE HCL 25 MG/ML IJ SOLN: 12.5000 mg | INTRAMUSCULAR | Status: AC | PRN

## 2013-04-07 MED ORDER — SENNOSIDES-DOCUSATE SODIUM 8.6-50 MG PO TABS: 2.0000 | ORAL_TABLET | Freq: Every evening | ORAL | Status: DC | PRN

## 2013-04-07 MED ORDER — DEXTROMETHORPHAN POLISTIREX 30 MG/5ML PO LQCR: 30.0000 mg | Freq: Two times a day (BID) | ORAL | Status: AC

## 2013-04-07 NOTE — Discharge Summary (Signed)
Physician Discharge Summary  Shahidah Nesbitt Liddy HWE:[id number] DOB: 23-Dec-1930 DOA: 03/31/2013  PCP: Kathlene November, MD  Admit date: 03/31/2013 Discharge date: 04/07/2013  Recommendations for Outpatient Follow-up:  1. The patient is being discharged to a residential hospice facility.  Discharge Diagnoses:  Principal Problem:    Acquired dysphasia secondary to extrinsic compression of the esophagus from lung mass associated with vomiting Active Problems:    Dehydration    Adenocarcinoma of lung, stage 4    Pleural effusion    Nausea    Hypokalemia    Protein-calorie malnutrition, severe    DNR (do not resuscitate)    Palliative care patient   Discharge Condition: Terminal.  Diet recommendation: Dysphasia diet with pured foods/comfort feeds as tolerated.  History of present illness:  Beth Collier is an 80 y.o. female with a PMH of recently diagnosed adenocarcinoma of the lung, status post bronchoscopy 03/28/13 as well as recent evaluation for dysphasia status post esophageal dilatation with endoscopy 03/21/13 with dysphasia determined to be secondary to extrinsic compression of the esophagus from lung mass, who was admitted on 03/31/13 with worsening dysphasia and vomiting.  Hospital Course by problem:  Principal Problem:  Acquired dysphasia secondary to extrinsic compression of the esophagus from lung mass associated with nausea and vomiting / dehydration   PEG tube placed 04/04/13 after a full discussion with the patient about this 04/03/2013 (patient indicated she wanted to proceed and felt to be competent to make this decision, although the family disagreed and felt that the tube should not have been placed without discussing it with them first). Was scheduled to undergo radiation simulation and initiation of radiation treatment 03/04/13 however family requested to cancel the planned treatment. Emergent palliative care consultation was requested to clarify goals of care with the patient  and her family. At this point, both the patient and her family tell me along with Dr. Hilma Favors that she does not want any further treatment for her malignancy and her goals are "comfort "and "peace ".Continue dysphagia 1 diet and thin liquids. Being discharged to a residential hospice facility for end-of-life care.  Active Problems:  Metabolic acidosis  Likely from starvation ketosis. Serum lactate within normal limits. No further lab draws done after goals of care outlined.  Adenocarcinoma of lung, stage 4 with brain metastasis  Seen by Dr. Worthy Flank who had a discussion of treatment options presented to the patient and her family. At this time, the family and the patient do not want further treatment after a full discussion of treatment options were discussed with the patient status post palliative care consultation, kindly done by Dr. Hilma Favors 04/04/13 to clarify goals of care.  Pleural effusion  Status post BAL 03/29/13, cultures negative to date. Pulmonology evaluated the patient on 04/01/13. Recommended right thoracentesis depending on course and symptoms if needed. Goal is no comfort with no aggressive interventions wanted. Hypokalemia  Resolved with potassium added to IV fluids. No further lab draws.  Protein-calorie malnutrition, severe  PEG tube placed 04/03/13. Hold tube feeds based on goals of care. Can use the PEG tube, if needed, for medications.  Procedures:  PEG tube placed 04/04/13.  Consultations:  Dr. Curt Bears, Oncology   Dr. Marye Round, Radiation oncology   Dr. Brand Males, Pulmonology   Dr. Rhea Pink, Palliative Care.   Discharge Exam: Filed Vitals:   04/07/13 0533  BP: 135/81  Pulse: 106  Temp: 98.4 F (36.9 C)  Resp: 16   Filed Vitals:   04/06/13  0507 04/06/13 1322 04/06/13 2141 04/07/13 0533  BP: 129/80 119/74 116/64 135/81  Pulse: 102 100 123 106  Temp: 98 F (36.7 C) 98.6 F (37 C) 99.2 F (37.3 C) 98.4 F (36.9 C)  TempSrc: Oral  Oral Oral Oral  Resp:  16 20 16   Height:      Weight:      SpO2: 96% 96% 86% 92%   Gen: NAD  Cardiovascular: Tachycardic, No M/R/G  Respiratory: Lungs CTAB, but diminished  Gastrointestinal: Abdomen soft, minimally tender at PEG tube insertion site, + BS, PEG tube left upper quadrant  Extremities: No C/E/C  Discharge Instructions  Discharge Orders   Future Appointments Provider Department Dept Phone   04/08/2013 9:55 AM [facility] [phone]   04/09/2013 10:00 AM Rosalita Chessman, [place] [facility]   04/09/2013 5:00 PM [facility] [phone]   04/10/2013 6:05 PM [facility] [phone]   04/11/2013 5:00 PM [facility] [phone]   04/12/2013 5:00 PM [facility] [phone]   04/15/2013 4:15 PM [facility] [phone]   04/16/2013 5:00 PM [facility] [phone]   04/17/2013 5:00 PM [facility] [phone]   04/18/2013 5:00 PM [facility] [phone]   04/19/2013 5:00 PM [facility] [phone]   08/06/2013 10:00 AM Rosalita Chessman, [place] [facility]   Future Orders Complete By Expires   Call MD for:  persistant nausea and vomiting  As directed    Call MD for:  severe uncontrolled pain  As directed    Diet general  As directed    Scheduling Instructions:     Liquids, pureed foods as tolerated.   Increase activity slowly  As directed        Medication List    STOP taking these  medications       ALPRAZolam 0.5 MG tablet  Commonly known as:  XANAX     atenolol 25 MG tablet  Commonly known as:  TENORMIN     ibuprofen 200 MG tablet  Commonly known as:  ADVIL,MOTRIN     imipramine 25 MG tablet  Commonly known as:  TOFRANIL     PRILOSEC OTC 20 MG tablet  Generic drug:  omeprazole     zolpidem 5 MG tablet  Commonly known as:  AMBIEN      TAKE these medications       dextromethorphan 30 MG/5ML liquid  Commonly known as:  DELSYM  Take 5 mLs (30 mg total) by mouth 2 (two) times daily.     hydroxypropyl methylcellulose 2.5 % ophthalmic solution  Commonly known as:  ISOPTO TEARS  Place 1 drop into both eyes 2 (two) times daily as needed for dry eyes.     LORazepam 2 MG/ML concentrated solution  Commonly known as:  ATIVAN  Take 0.5 mLs (1 mg total) by mouth every 4 (four) hours as needed for anxiety or sleep.     metoCLOPramide 5 MG/ML injection  Commonly known as:  REGLAN  Inject 1 mL (5 mg total) into the vein every 8 (eight) hours as needed.  ondansetron 4 MG/2ML Soln injection  Commonly known as:  ZOFRAN  Inject 2 mLs (4 mg total) into the vein every 4 (four) hours as needed for nausea.     promethazine 25 MG/ML injection  Commonly known as:  PHENERGAN  Inject 0.5 mLs (12.5 mg total) into the vein every 4 (four) hours as needed for nausea or vomiting.     sodium chloride 0.65 % Soln nasal spray  Commonly known as:  OCEAN  Place 1 spray into both nostrils at bedtime as needed for congestion.          The results of significant diagnostics from this hospitalization (including imaging, microbiology, ancillary and laboratory) are listed below for reference.    Significant Diagnostic Studies: Dg Chest 2 View  03/31/2013   CLINICAL DATA:  Shortness of breath  EXAM: CHEST  2 VIEW  COMPARISON:  03/13/2013  FINDINGS: Cardiac shadow is stable. A scoliosis is again identified of the thoracolumbar spine. Increased density is again noted in the  right lower lobe similar to that seen on the recent chest CT examination consistent with underlying mass and postobstructive change. A right-sided effusion is noted.  IMPRESSION: Persistent right lower lobe mass lesion with associated effusion and obstructive pneumonia.   Electronically Signed   By: Inez Catalina M.D.   On: 03/31/2013 14:27   Dg Chest 2 View  03/13/2013   CLINICAL DATA:  Dysphasia.  EXAM: CHEST  2 VIEW  COMPARISON:  None.  FINDINGS: Right lower lobe and right mid lobe atelectasis and/or infiltrates noted. Right pleural effusion present. Small left pleural effusion cannot be excluded. No pneumothorax. Heart size and pulmonary vascularity normal. Degenerative changes and scoliosis thoracic spine. Degenerative changes both shoulders. No acute bony abnormality.  IMPRESSION: 1. Prominent atelectasis and/or infiltrates in the lower lobe and possibly right middle lobe. Associated right pleural effusion.  2.  Tiny left pleural effusion.   Electronically Signed   By: Marcello Moores  Register   On: 03/13/2013 16:37   Ct Chest W Contrast  03/25/2013   CLINICAL DATA:  Recent esophageal dilation, evaluate for periesophageal mass  EXAM: CT CHEST WITH CONTRAST  TECHNIQUE: Multidetector CT imaging of the chest was performed during intravenous contrast administration.  CONTRAST:  75 cc OMNIPAQUE IOHEXOL 300 MG/ML  SOLN intravenously  COMPARISON:  DG CHEST 2 VIEW dated 03/13/2013  FINDINGS: There is a large right hilar mass which measures 5 cm transversely x 3.4 cm AP x 3.4 cm longitudinally. There is a large right pleural effusion. There is a 1.3 cm diameter lymph node anterior to the right mainstem bronchus. There is poorly defined sub carinal lymphadenopathy which closely abuts the lower third of the esophagus. The cardiac chambers are normal in size. The caliber of the thoracic aorta is normal. The right hilar mass does produce mass effect upon the right main descending pulmonary artery.  At lung window settings.  There is minimal atelectasis of the inferior aspect of the right upper lobe adjacent to the right hilar mass. There is moderate atelectasis of the posterior medial aspect of the right middle lobe and more significant atelectasis in the superior segment of the right lower lobe. The left lung exhibits no atelectasis or infiltrate, but there are numerous subcentimeter pulmonary nodules present likely reflecting intrapulmonary metastatic disease.  Within the upper abdomen the observed portions of the liver are normal. The left adrenal gland exhibits a mass measuring 1.9 x 2.3 cm. The thoracic vertebral bodies are preserved in height. There is mild  disc space narrowing at several levels. The sternum appears intact. No acute rib lesion is demonstrated. The breast parenchyma and the axillary regions exhibit no acute abnormalities.  IMPRESSION: 1. There is a large right hilar mass worrisome for malignancy. Its medial border abuts the lower 1/3 of the thoracic esophagus. There is also a large right pleural effusion. There is atelectasis of portions of the right lung adjacent to the hilar mass and pleural effusion. 2. There are multiple subcentimeter nodules within the otherwise normal-appearing left lung worrisome for intra- pulmonary metastatic disease. 3. There are borderline to mildly enlarged mediastinal lymph nodes. 4. There is enlargement of the left adrenal gland worrisome for metastatic disease.   Electronically Signed   By: David  Martinique   On: 03/25/2013 15:27   Mr Brain W Wo Contrast  04/03/2013   CLINICAL DATA:  Lung cancer staging, newly diagnosed hilar mass. Recent history of dysphagia.  EXAM: MRI HEAD WITHOUT AND WITH CONTRAST  TECHNIQUE: Multiplanar, multiecho pulse sequences of the brain and surrounding structures were obtained without and with intravenous contrast.  CONTRAST:  86mL MULTIHANCE GADOBENATE DIMEGLUMINE 529 MG/ML IV SOLN  COMPARISON:  CT of the head of report dated September 15, 2002 though images  are not available for direct comparison.  FINDINGS: There are multiple subtle sub cm foci of abnormal enhancement within the supratentorial brain, along the periventricular and a gray-white matter junction, some of the show minimal FLAIR and T2 hyperintense signal, though are poorly defined on the axial T1-1 postcontrast, this may reflect temporal changes as, contrast-enhanced sequences acquired at a slightly later phase better demonstrate the lesions. Lesions are as follows:  6 mm right periatrial white matter lesion, centrum semiovale 4 mm lesion (sagittal 12/21), left posterior frontal cortical 8 mm lesion (axial 34/52), left temporal 5 mm lesion (axial 19/52), high right parietal 4 mm lesion (sagittal 7/21), 2 right occipital 3-4 mm lesions (sagittal 5/21), possible left occipital 4 mm lesion (sagittal 16/21), possible right posterior frontal lesion measuring 7 mm (sagittal 8/21).  Left pontine capillary telangiectasias, left frontal developmental venous anomaly.  The ventricles and sulci are overall normal for patient's age. Patchy supratentorial white matter FLAIR T2 hyperintensities, exclusive of the aforementioned abnormalities may reflect chronic small vessel ischemic disease. Tiny focus of reduced diffusion associated with the right periatrial lesion, without reduced diffusion to suggest acute ischemia. No midline shift or mass effect. No susceptibility artifact to suggest hemorrhage.  No abnormal extra-axial fluid collections or masses.  Ocular globes and orbital contents are nonsuspicious, status post bilateral ocular lens implants. Visualized paranasal sinuses and mastoid air cells are well aerated. Mild temporomandibular osteoarthrosis. No abnormal sellar expansion. No cerebellar tonsillar descent below the foramen magnum. Severe degenerative change of the included cervical spine.  IMPRESSION: At least 7 enhancing supratentorial lesions concerning for metastatic disease with additional possible left  occipital right parietal lesions as described above. No midline shift or mass effect.  Moderate white matter changes suggest chronic small vessel ischemic disease. Mild brain atrophy.   Electronically Signed   By: Elon Alas   On: 04/03/2013 05:04   Ct Abdomen Pelvis W Contrast  04/03/2013   CLINICAL DATA:  Left-sided pain.  History of diverticulitis.  EXAM: CT ABDOMEN AND PELVIS WITH CONTRAST  TECHNIQUE: Multidetector CT imaging of the abdomen and pelvis was performed using the standard protocol following bolus administration of intravenous contrast.  CONTRAST:  61mL OMNIPAQUE IOHEXOL 300 MG/ML  SOLN  COMPARISON:  Chest CT, 03/25/2013  FINDINGS: There  are scattered diverticula along the left colon, mostly along the sigmoid colon, but no evidence of diverticulitis.  There is mild inflammatory type change in the fat adjacent to the terminal ileum. What appears to be the appendix is closely approximated to the terminal ileum. It does not appear to be thickened.  Bowel is otherwise unremarkable.  Large right pleural effusion. Masslike consolidation is noted in the central right lower lobe. There multiple other pulmonary nodules. These findings are stable from the recent chest CT.  Low-density lesion lies in the medial segment of the left lobe of the liver adjacent to the falciform ligament. It measures 15 mm. This may reflect focal fat. Liver is otherwise unremarkable. Normal spleen. The gallbladder is distended but otherwise unremarkable. No bile duct dilation. Normal pancreas. Mild thickening of the left adrenal gland suggesting hyperplasia. Normal right adrenal gland. Small low-density lesion arises from the upper pole of the right kidney, most likely a cyst. It is stable. Kidneys otherwise unremarkable. Normal ureters. The bladder is unremarkable.  2.1 cm hyper attenuating area in the upper uterine segment likely a fibroid. Uterus and adnexa are otherwise unremarkable.  No adenopathy.  No ascites.  There  are marked degenerative changes as well as a scoliosis of the visible spine. No osteoblastic or osteolytic lesions.  IMPRESSION: 1. No evidence of diverticulitis. There are no findings to explain left lower quadrant pain. Patient does have left colon diverticula. 2. Mild inflammatory changes seen adjacent to the terminal ileum. The appendix is closely approximated to the terminal ileum not well defined. It does not appear thickened. These findings are nonspecific. An infectious or inflammatory ileitis possible. No other evidence of acute abdomen or pelvic process. 3. Masslike opacity in the central right lower lobe associated with a large right pleural effusion and multiple additional pulmonary nodules. This is unchanged from the recent prior chest CT. 4. 15 mm area of hypoattenuation in the medial segment of the left lobe of the liver most likely an area of focal fatty infiltration. Mild left adrenal thickening likely hyperplasia. There are significant degenerative changes throughout the visualized spine.   Electronically Signed   By: Lajean Manes M.D.   On: 04/03/2013 12:03   Ir Gastrostomy Tube Mod Sed  04/04/2013   CLINICAL DATA:  Lung carcinoma, dysphagia. Needs enteral feeding support.  EXAM: PERC PLACEMENT GASTROSTOMY  TECHNIQUE: The procedure, risks, benefits, and alternatives were explained to the patient. Questions regarding the procedure were encouraged and answered. The patient understands and consents to the procedure. As antibiotic prophylaxis, vancomycin 1 g was ordered pre-procedure and administered intravenously within one hour of incision.Progression of previously administered oral barium into the colon was confirmed fluoroscopically. A 5 French angiographic catheter was placed as orogastric tube. The upper abdomen was prepped with Betadine, draped in usual sterile fashion, and infiltrated locally with 1% lidocaine.  Intravenous Fentanyl and Versed were administered as conscious sedation during  continuous cardiorespiratory monitoring by the radiology RN, with a total moderate sedation time of 19 minutes.  Stomach was insufflated using air through the orogastric tube. An 30 French sheath needle was advanced percutaneously into the gastric lumen under fluoroscopy. Gas could be aspirated and a small contrast injection confirmed intraluminal spread. The sheath was exchanged over a guidewire for a 9 [place] vascular sheath, through which the snare device was advanced and used to snare a guidewire passed through the orogastric tube. This was withdrawn, and the snare attached to the 20 French pull-through gastrostomy tube, which was advanced  antegrade, positioned with the internal bumper securing the anterior gastric wall to the anterior abdominal wall. Small contrast injection confirms appropriate positioning. The external bumper was applied and the catheter was flushed. No immediate complication.  FLUOROSCOPY TIME:  3 min 6 seconds  IMPRESSION: 1. Technically successful 20 French pull-through gastrostomy placement under fluoroscopy.   Electronically Signed   By: Arne Cleveland M.D.   On: 04/04/2013 16:36    Labs:  Basic Metabolic Panel:  Recent Labs Lab 04/01/13 0324 04/02/13 0350 04/03/13 0345 04/04/13 0410  NA 137 133* 136* 137  K 3.1* 3.9 3.4* 3.7  CL 98 99 102 109  CO2 19 13* 11* 11*  GLUCOSE 79 81 93 110*  BUN 12 7 7 10   CREATININE 0.55 0.44* 0.46* 0.47*  CALCIUM 8.5 8.6 9.0 9.1  MG  --  1.9  --   --   PHOS  --  2.4  --   --    GFR Estimated Creatinine Clearance: 38 ml/min (by C-G formula based on Cr of 0.47). Liver Function Tests:  Recent Labs Lab 04/01/13 0324 04/02/13 0350 04/03/13 0345  AST 18 23 32  ALT 9 12 13   ALKPHOS 56 58 56  BILITOT 0.5 0.6 0.4  PROT 6.6 6.8 7.1  ALBUMIN 3.3* 3.5 3.5   Coagulation profile  Recent Labs Lab 04/03/13 1240  INR 1.11    CBC:  Recent Labs Lab 03/31/13 1555  WBC 5.0  NEUTROABS 3.2  HGB 13.8  HCT 40.3  MCV 92.0    PLT 316   CBG:  Recent Labs Lab 04/04/13 2355 04/05/13 0541  GLUCAP 148* 146*   Microbiology Recent Results (from the past 240 hour(s))  AFB CULTURE WITH SMEAR     Status: None   Collection Time    03/29/13  8:33 AM      Result Value Ref Range Status   Specimen Description BRONCHIAL ALVEOLAR LAVAGE   Final   Special Requests Normal   Final   ACID FAST SMEAR     Final   Value: NO ACID FAST BACILLI SEEN     Performed at [facility]   Culture     Final   Value: CULTURE WILL BE EXAMINED FOR 6 WEEKS BEFORE ISSUING A FINAL REPORT     Performed at [facility]   Report Status PENDING   Incomplete  FUNGUS CULTURE W SMEAR     Status: None   Collection Time    03/29/13  8:33 AM      Result Value Ref Range Status   Specimen Description BRONCHIAL ALVEOLAR LAVAGE   Final   Special Requests Normal   Final   Fungal Smear     Final   Value: NO YEAST OR FUNGAL ELEMENTS SEEN     Performed at [facility]   Culture     Final   Value: CULTURE IN PROGRESS FOR FOUR WEEKS     Performed at [facility]   Report Status PENDING   Incomplete  URINE CULTURE     Status: None   Collection Time    03/31/13  3:10 PM      Result Value Ref Range Status   Specimen Description URINE, CATHETERIZED   Final   Special Requests NONE   Final   Culture  Setup Time     Final   Value: 03/31/2013 22:28     Performed at [facility]     Final  Value: NO GROWTH     Performed at [facility]   Culture     Final   Value: NO GROWTH     Performed at [facility]   Report Status 04/01/2013 FINAL   Final    Time coordinating discharge: 35 minutes.  Signed:  Tyvon Eggenberger  Pager [phone] [facility] 04/07/2013, 1:47 PM

## 2013-04-07 NOTE — Progress Notes (Signed)
Wasted 0.5 mg of dilaudid with [employer] as witness.

## 2013-04-07 NOTE — Progress Notes (Signed)
Patient discharged to [facility] hospice, copies of all discharge medications and instructions sent to facility. Patient transported via [place].

## 2013-04-07 NOTE — Progress Notes (Signed)
Per Sheree', [facility], it is likely that they will have a bed for Pt today.  Sheree' to meet with family around 1 today to discuss possible admission.  MD made aware.  CSW to continue to follow.  Bernita Raisin, [place] [facility] [phone]

## 2013-04-07 NOTE — Progress Notes (Signed)
Palliative Care Team at [place] Note   SUBJECTIVE: Beth Collier is declining again today. She is more hypoxic, low grade temp developed. She reports being at peace, she expresses gratitude. She continues to have nausea requiring frequent PRNs and pain in her back. She now has a cough and is requiring nasal cannula O2.  Interval Events: Newly diagnosed NSCLC  OBJECTIVE: Vital Signs: BP 135/81  Pulse 106  Temp(Src) 98.4 F (36.9 C) (Oral)  Resp 16  Ht 4' 11.6" (1.514 m)  Wt 43.636 kg (96 lb 3.2 oz)  BMI 19.04 kg/m2  SpO2 92%   Intake and Output: 02/14 0701 - 02/15 0700 In: 1848.8 [P.O.:120; I.V.:1708.8] Out: 600 [Urine:600]  Physical Exam: Frail, Awake, family at bedside, No distress, More lethargic HOH, neck non-tender Not agitated, calm Minimal BS right lung  Allergies  Allergen Reactions  . Cephalexin Hives and Swelling    Dentist gave  . Clindamycin Hives and Swelling    Dentist gave  . Codeine Hives and Swelling  . Demerol [Meperidine] Nausea And Vomiting  . Hydrocodone-Acetaminophen     Might have been give in ER  . Penicillins Hives and Swelling    Medications: Scheduled Meds:  . hydrALAZINE  10 mg Intravenous 3 times per day    Continuous Infusions: .  sodium bicarbonate  infusion 1000 mL 75 mL/hr at 04/07/13 0500    PRN Meds: HYDROcodone-acetaminophen, HYDROmorphone (DILAUDID) injection, LORazepam, metoCLOPramide (REGLAN) injection, ondansetron (ZOFRAN) IV, ondansetron (ZOFRAN) IV, ondansetron, promethazine, zolpidem  Stool Softner: yes  Palliative Performance Scale: 30 %   Pain Present?: controlled, just had PRN  Labs: CBC    Component Value Date/Time   WBC 5.0 03/31/2013 1555   RBC 4.38 03/31/2013 1555   HGB 13.8 03/31/2013 1555   HCT 40.3 03/31/2013 1555   PLT 316 03/31/2013 1555   MCV 92.0 03/31/2013 1555   MCH 31.5 03/31/2013 1555   MCHC 34.2 03/31/2013 1555   RDW 12.6 03/31/2013 1555   LYMPHSABS 1.0 03/31/2013 1555   MONOABS 0.7 03/31/2013 1555    EOSABS 0.1 03/31/2013 1555   BASOSABS 0.0 03/31/2013 1555    CMET     Component Value Date/Time   NA 137 04/04/2013 0410   K 3.7 04/04/2013 0410   CL 109 04/04/2013 0410   CO2 11* 04/04/2013 0410   GLUCOSE 110* 04/04/2013 0410   BUN 10 04/04/2013 0410   CREATININE 0.47* 04/04/2013 0410   CALCIUM 9.1 04/04/2013 0410   PROT 7.1 04/03/2013 0345   ALBUMIN 3.5 04/03/2013 0345   AST 32 04/03/2013 0345   ALT 13 04/03/2013 0345   ALKPHOS 56 04/03/2013 0345   BILITOT 0.4 04/03/2013 0345   GFRNONAA >90 04/04/2013 0410   GFRAA >90 04/04/2013 0410     Imaging: Chest Xray Reviewed/ Impressions: Large Right Lower lobe mass with developing post obstructive PNA  CT scan of the Head Reviewed/Impressions: Multiple Brain Mets  ASSESSMENT/ PLAN:  80 yo with stage 4 adenocarcinoma of the lung who has opted for comfort care only and no aggressive interventions based on prognosis, preferences related to QOL and her desire to not unecessarily prolong her life in a weak and debilitated state of health. Her son and DIL are her primary support system and they are fully supportive and strong advocates for her needs.  She has declined rapidly over the past couple of days- she has worsening pain, nausea and is now febrile and coughing from progression of her post-obstructive PNA. Minimal breath sounds in her  right lung fields. Her prognosis is very short-suspect <2 weeks if not a few days.  I provided reassurance and support to both patient and family.  Disposition: She has a bed at [facility].   25 minutes. Greater than 50%  of this time was spent counseling and coordinating care related to the above assessment and plan.   Acquanetta Chain, DO  04/07/2013, 11:13 AM  Please contact Palliative Medicine Team phone at [phone] for questions and concerns.

## 2013-04-07 NOTE — Progress Notes (Signed)
Per Bedelia Person', facility has a bed and are ready to accept Pt.  Family, RN and Md aware.  CSW provided facility with requested documents and arranged for transportation.  Pt to be d/c'd.  Bernita Raisin, [place] [employer] [phone]

## 2013-04-08 ENCOUNTER — Ambulatory Visit: Payer: Medicare Other

## 2013-04-09 ENCOUNTER — Ambulatory Visit: Payer: Medicare Other

## 2013-04-09 ENCOUNTER — Ambulatory Visit: Payer: Medicare Other | Admitting: Family Medicine

## 2013-04-09 ENCOUNTER — Encounter (HOSPITAL_COMMUNITY): Payer: Self-pay

## 2013-04-10 ENCOUNTER — Ambulatory Visit: Payer: Medicare Other

## 2013-04-11 ENCOUNTER — Ambulatory Visit: Payer: Medicare Other

## 2013-04-12 ENCOUNTER — Ambulatory Visit: Payer: Medicare Other

## 2013-04-12 ENCOUNTER — Telehealth: Payer: Self-pay | Admitting: *Deleted

## 2013-04-12 ENCOUNTER — Encounter (HOSPITAL_COMMUNITY): Payer: Self-pay

## 2013-04-12 NOTE — Telephone Encounter (Signed)
EGFR mutation detected.   Paperwork given to Dr Vista Mink to review.  SLJ

## 2013-04-15 ENCOUNTER — Ambulatory Visit: Payer: Medicare Other

## 2013-04-16 ENCOUNTER — Ambulatory Visit: Payer: Medicare Other

## 2013-04-16 ENCOUNTER — Telehealth: Payer: Self-pay | Admitting: Internal Medicine

## 2013-04-16 NOTE — Telephone Encounter (Signed)
LEFT MESSAGE FOR PATIENT TO RETURN CALL TO SCHEDULE NEW PATIENT APPT.  °

## 2013-04-17 ENCOUNTER — Ambulatory Visit: Payer: Medicare Other

## 2013-04-18 ENCOUNTER — Telehealth: Payer: Self-pay | Admitting: Internal Medicine

## 2013-04-18 ENCOUNTER — Ambulatory Visit: Payer: Medicare Other

## 2013-04-18 NOTE — Telephone Encounter (Signed)
PATIENT SON RETURN CALL AND STATED MOTHER IS IN HOSPICE AND TO CANCEL ALL APPT.

## 2013-04-18 NOTE — Telephone Encounter (Signed)
LEFT MESSAGE ON PATIENT SON CELL PHONE TO CALL BACK TO SCHEDULE MOTHER NEW PATIENT APPT.

## 2013-04-19 ENCOUNTER — Ambulatory Visit: Payer: Medicare Other

## 2013-04-22 ENCOUNTER — Ambulatory Visit: Payer: Medicare Other

## 2013-04-23 ENCOUNTER — Ambulatory Visit: Payer: Medicare Other

## 2013-04-24 ENCOUNTER — Ambulatory Visit: Payer: Medicare Other

## 2013-04-25 LAB — FUNGUS CULTURE W SMEAR
FUNGAL SMEAR: NONE SEEN
SPECIAL REQUESTS: NORMAL

## 2013-05-06 NOTE — Telephone Encounter (Signed)
PATIENT SON RETURN CALL AND STATED MOTHER IS IN HOSPICE AND TO CANCEL ALL APPT.

## 2013-05-12 LAB — AFB CULTURE WITH SMEAR (NOT AT ARMC)
Acid Fast Smear: NONE SEEN
Special Requests: NORMAL

## 2013-05-22 DEATH — deceased

## 2013-05-28 ENCOUNTER — Telehealth: Payer: Self-pay | Admitting: Internal Medicine

## 2013-05-28 NOTE — Telephone Encounter (Signed)
05/28/13 pt's daughter-in-law called in to let Dr. Larose Kells know that pt had passed away on 10-May-2013.

## 2013-05-29 NOTE — Telephone Encounter (Signed)
I appreciate the phone call, I tried to call back but there was no answer. We'll send a card.

## 2013-08-06 ENCOUNTER — Ambulatory Visit: Payer: Medicare Other | Admitting: Family Medicine

## 2013-08-29 DIAGNOSIS — C3430 Malignant neoplasm of lower lobe, unspecified bronchus or lung: Secondary | ICD-10-CM | POA: Insufficient documentation

## 2013-08-29 NOTE — Addendum Note (Signed)
Encounter addended by: Marye Round, MD on: 08/29/2013 11:49 AM<BR>     Documentation filed: Visit Diagnoses, Notes Section, Problem List, Follow-up Section, LOS Section

## 2013-08-29 NOTE — Progress Notes (Signed)
  Radiation Oncology         [phone] ________________________________  Name: Beth Collier MRN: [id number]  Date: 04/04/2013  DOB: [birth year]  SIMULATION AND TREATMENT PLANNING NOTE  DIAGNOSIS:  Lung cancer  Site:  Right lung  NARRATIVE:  The patient was brought to the [place] suite.  Identity was confirmed.  All relevant records and images related to the planned course of therapy were reviewed.   Written consent to proceed with treatment was confirmed which was freely given after reviewing the details related to the planned course of therapy had been reviewed with the patient.  Then, the patient was set-up in a stable reproducible  supine position for radiation therapy.  CT images were obtained.  Surface markings were placed.    Medically necessary complex treatment device(s) for immobilization:  wingboard.   The CT images were loaded into the planning software.  Then the target and avoidance structures were contoured.  Treatment planning then occurred.  The radiation prescription was entered and confirmed.  A total of 3 complex treatment devices were fabricated which relate to the designed radiation treatment fields. Each of these customized fields/ complex treatment devices will be used on a daily basis during the radiation course. I have requested : 3D Simulation  I have requested a DVH of the following structures: target, lungs, spinal cord.   PLAN:  The patient will receive 30 Gy in 10 fractions.  ________________________________   Jodelle Gross, MD, PhD

## 2015-05-12 IMAGING — CT CT CHEST W/ CM
2 of 4 series · 15 of 36 positions shown, 18 images · IV contrast (Omnipaque 300)
Comparison: DG CHEST 2 VIEW dated 03/13/2013

CLINICAL DATA: Recent esophageal dilation, evaluate for
periesophageal mass

EXAM:
CT CHEST WITH CONTRAST
TECHNIQUE: Multidetector CT imaging of the chest was performed during
intravenous contrast administration.
CONTRAST:  75 cc OMNIPAQUE IOHEXOL 300 MG/ML  SOLN intravenously

[Series 2: chest routine with · axial · 0.65mm/px · z∈[-256,-0]mm · 12 of 61 slices shown, 15 images]
[im 5/61  mediastinal]
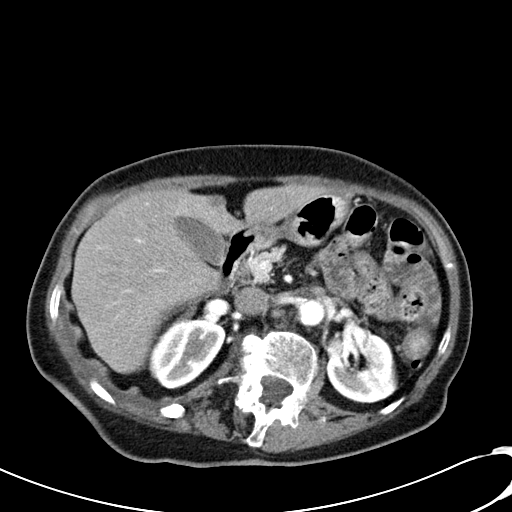
[im 5/61  lung]
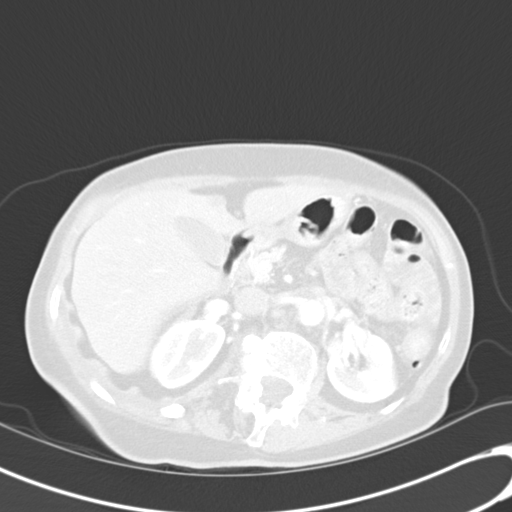
[im 10/61  lung]
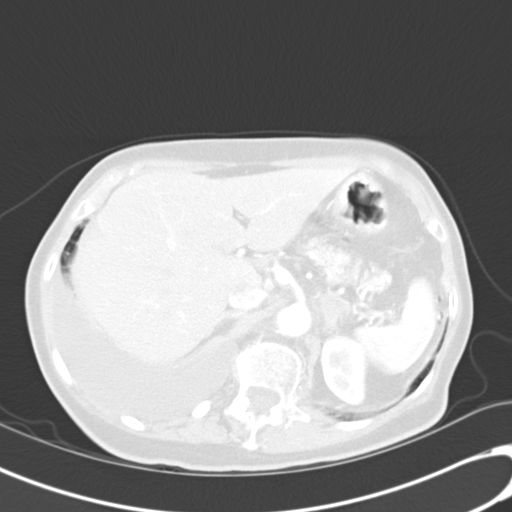
[im 14/61  lung]
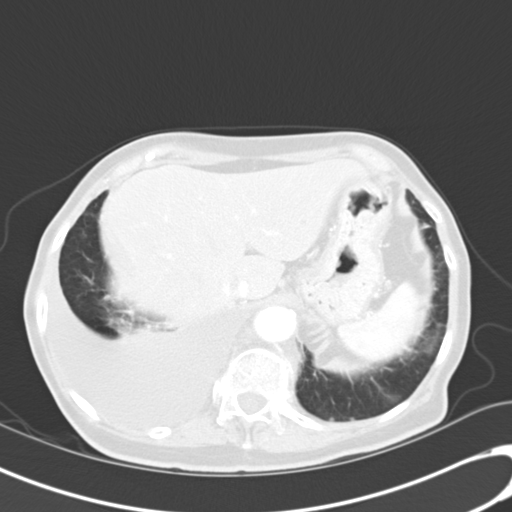
[im 19/61  lung]
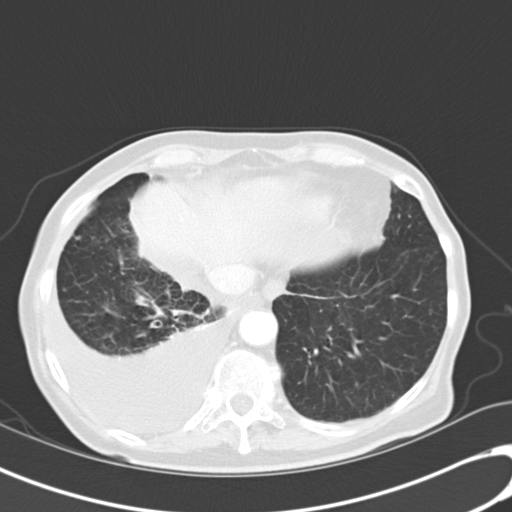
[im 24/61  mediastinal]
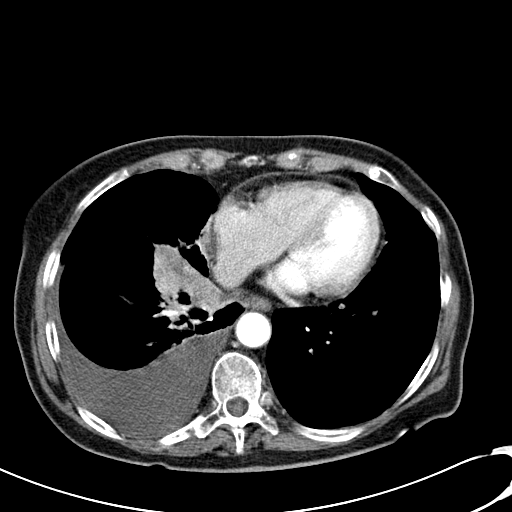
[im 24/61  lung]
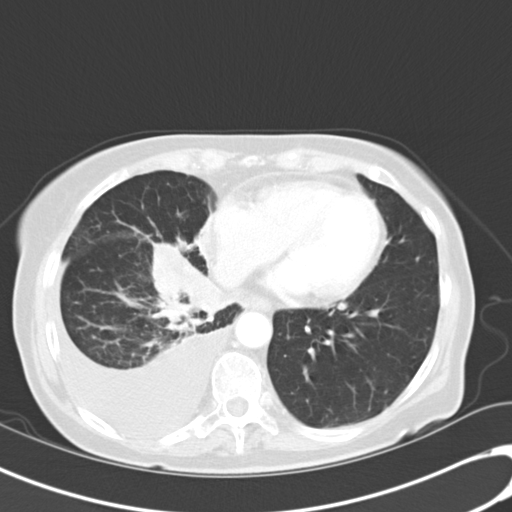
[im 28/61  lung]
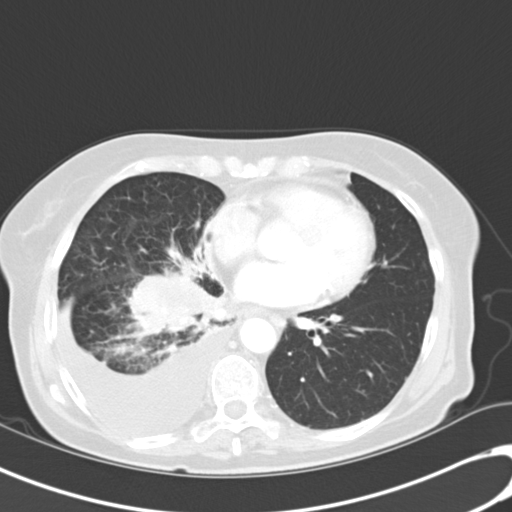
[im 33/61  lung]
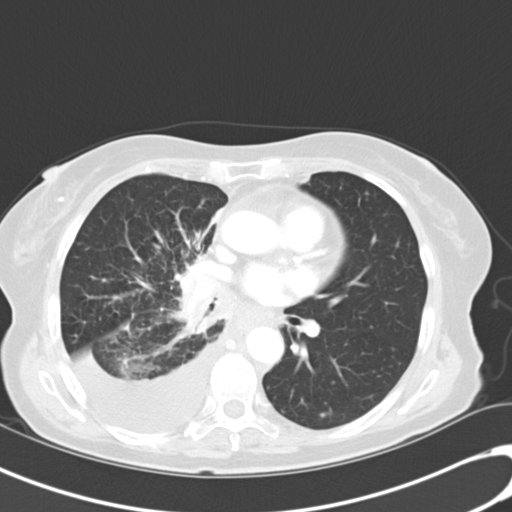
[im 37/61  lung]
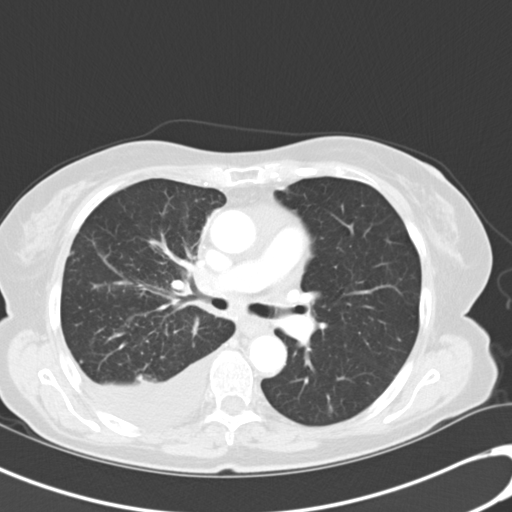
[im 42/61  mediastinal]
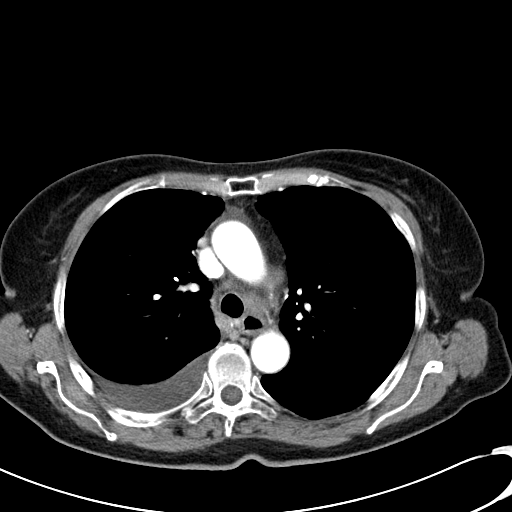
[im 42/61  lung]
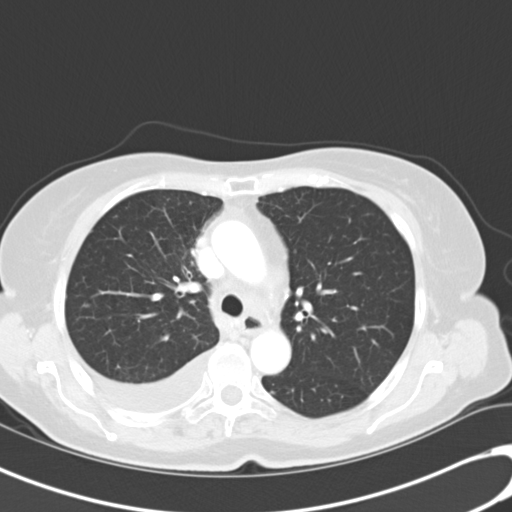
[im 47/61  lung]
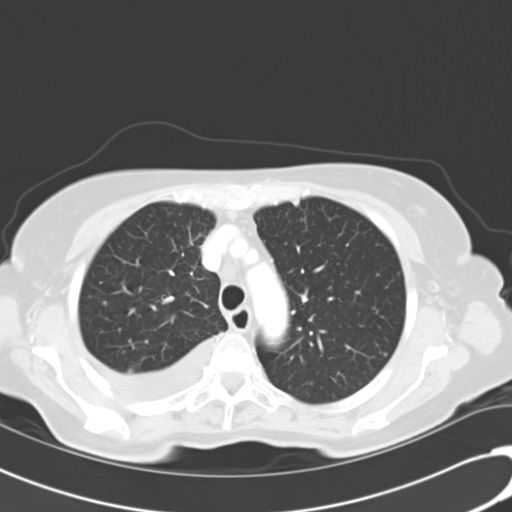
[im 51/61  lung]
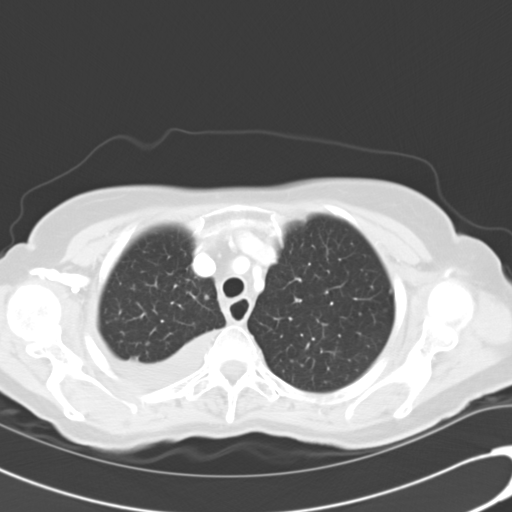
[im 56/61  lung]
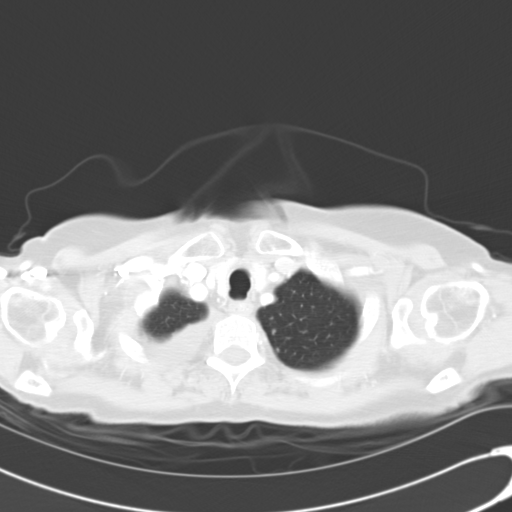

[Series 602: cor · coronal · 0.65mm/px · 3 of 100 slices shown]
[im 20/100  lung]
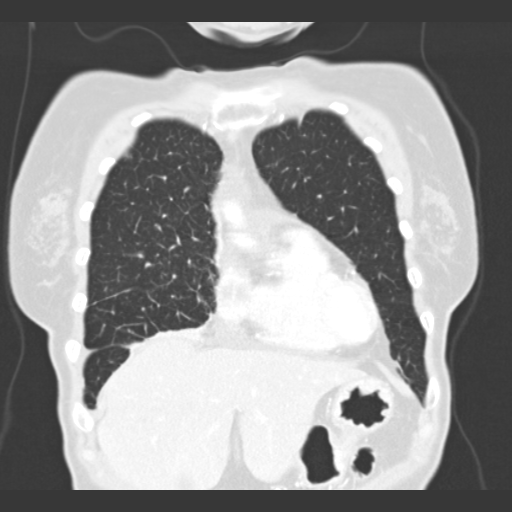
[im 40/100  lung]
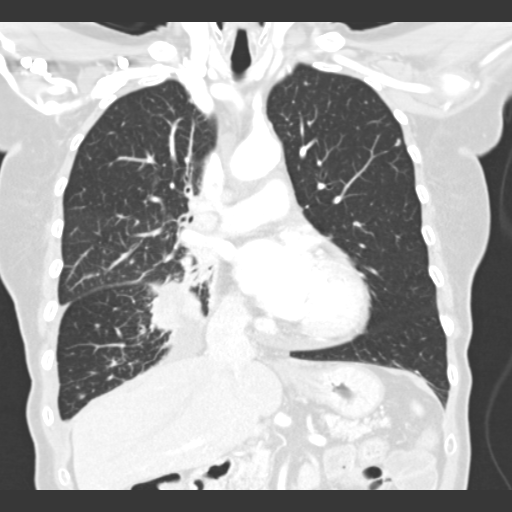
[im 60/100  lung]
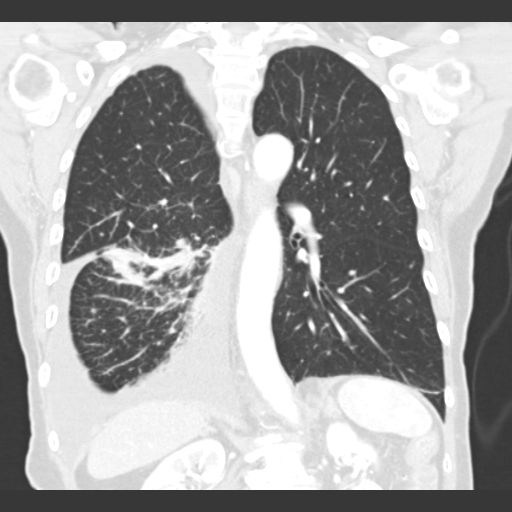

[15 of 36 positions shown; findings below may reference images not displayed]

FINDINGS: There is a large right hilar mass which measures 5 cm transversely x
3.4 cm AP x 3.4 cm longitudinally. There is a large right pleural
effusion. There is a 1.3 cm diameter lymph node anterior to the
right mainstem bronchus. There is poorly defined sub carinal
lymphadenopathy which closely abuts the lower third of the
esophagus. The cardiac chambers are normal in size. The caliber of
the thoracic aorta is normal. The right hilar mass does produce mass
effect upon the right main descending pulmonary artery.

At lung window settings. There is minimal atelectasis of the
inferior aspect of the right upper lobe adjacent to the right hilar
mass. There is moderate atelectasis of the posterior medial aspect
of the right middle lobe and more significant atelectasis in the
superior segment of the right lower lobe. The left lung exhibits no
atelectasis or infiltrate, but there are numerous subcentimeter
pulmonary nodules present likely reflecting intrapulmonary
metastatic disease.

Within the upper abdomen the observed portions of the liver are
normal. The left adrenal gland exhibits a mass measuring 1.9 x
cm. The thoracic vertebral bodies are preserved in height. There is
mild disc space narrowing at several levels. The sternum appears
intact. No acute rib lesion is demonstrated. The breast parenchyma
and the axillary regions exhibit no acute abnormalities.
IMPRESSION: 1. There is a large right hilar mass worrisome for malignancy. Its
medial border abuts the lower [DATE] of the thoracic esophagus. There
is also a large right pleural effusion. There is atelectasis of
portions of the right lung adjacent to the hilar mass and pleural
effusion.
2. There are multiple subcentimeter nodules within the otherwise
normal-appearing left lung worrisome for intra- pulmonary metastatic
disease.
3. There are borderline to mildly enlarged mediastinal lymph nodes.
4. There is enlargement of the left adrenal gland worrisome for
metastatic disease.

## 2015-05-18 IMAGING — CR DG CHEST 2V
2 series · 2 of 2 positions shown · non-contrast
Comparison: 03/13/2013

CLINICAL DATA: Shortness of breath

EXAM:
CHEST  2 VIEW

[w chest pa]
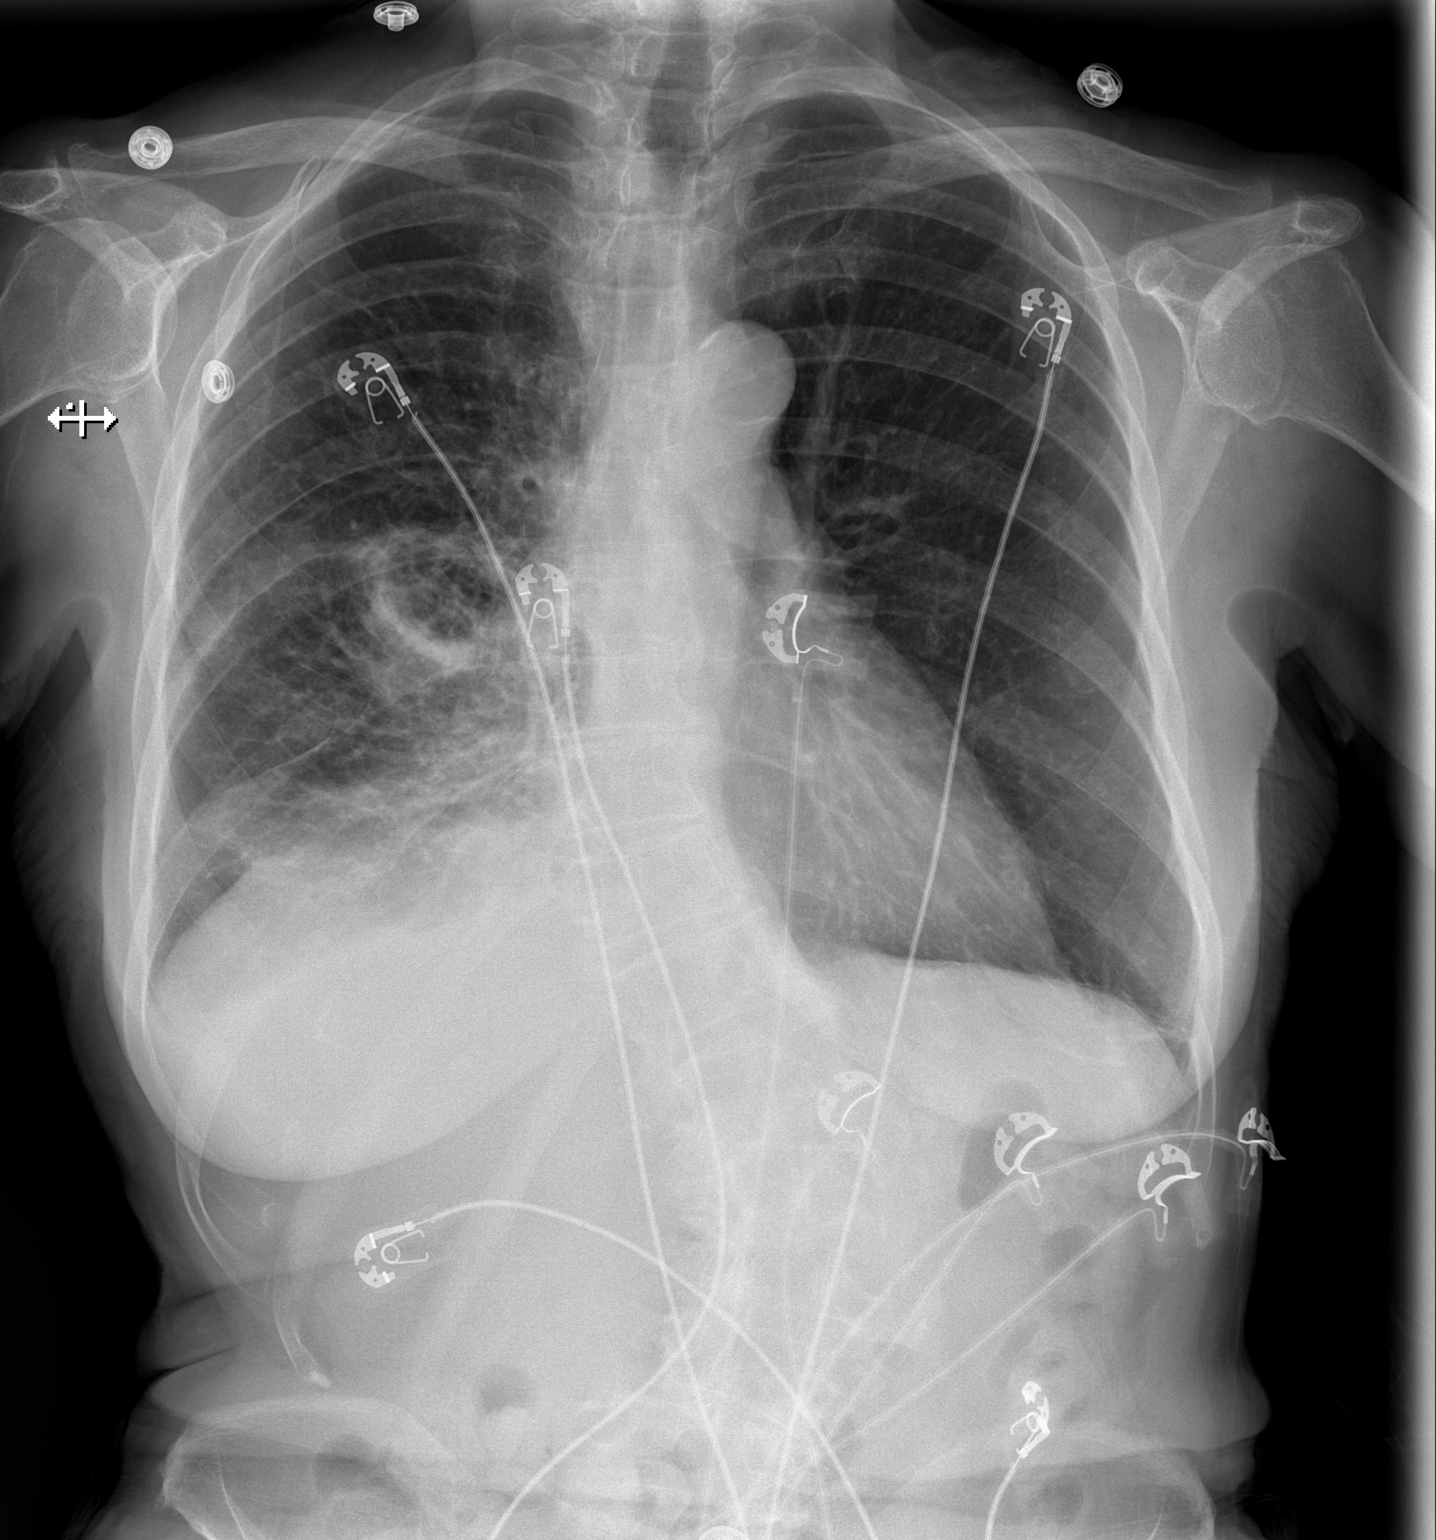

[w chest lat]
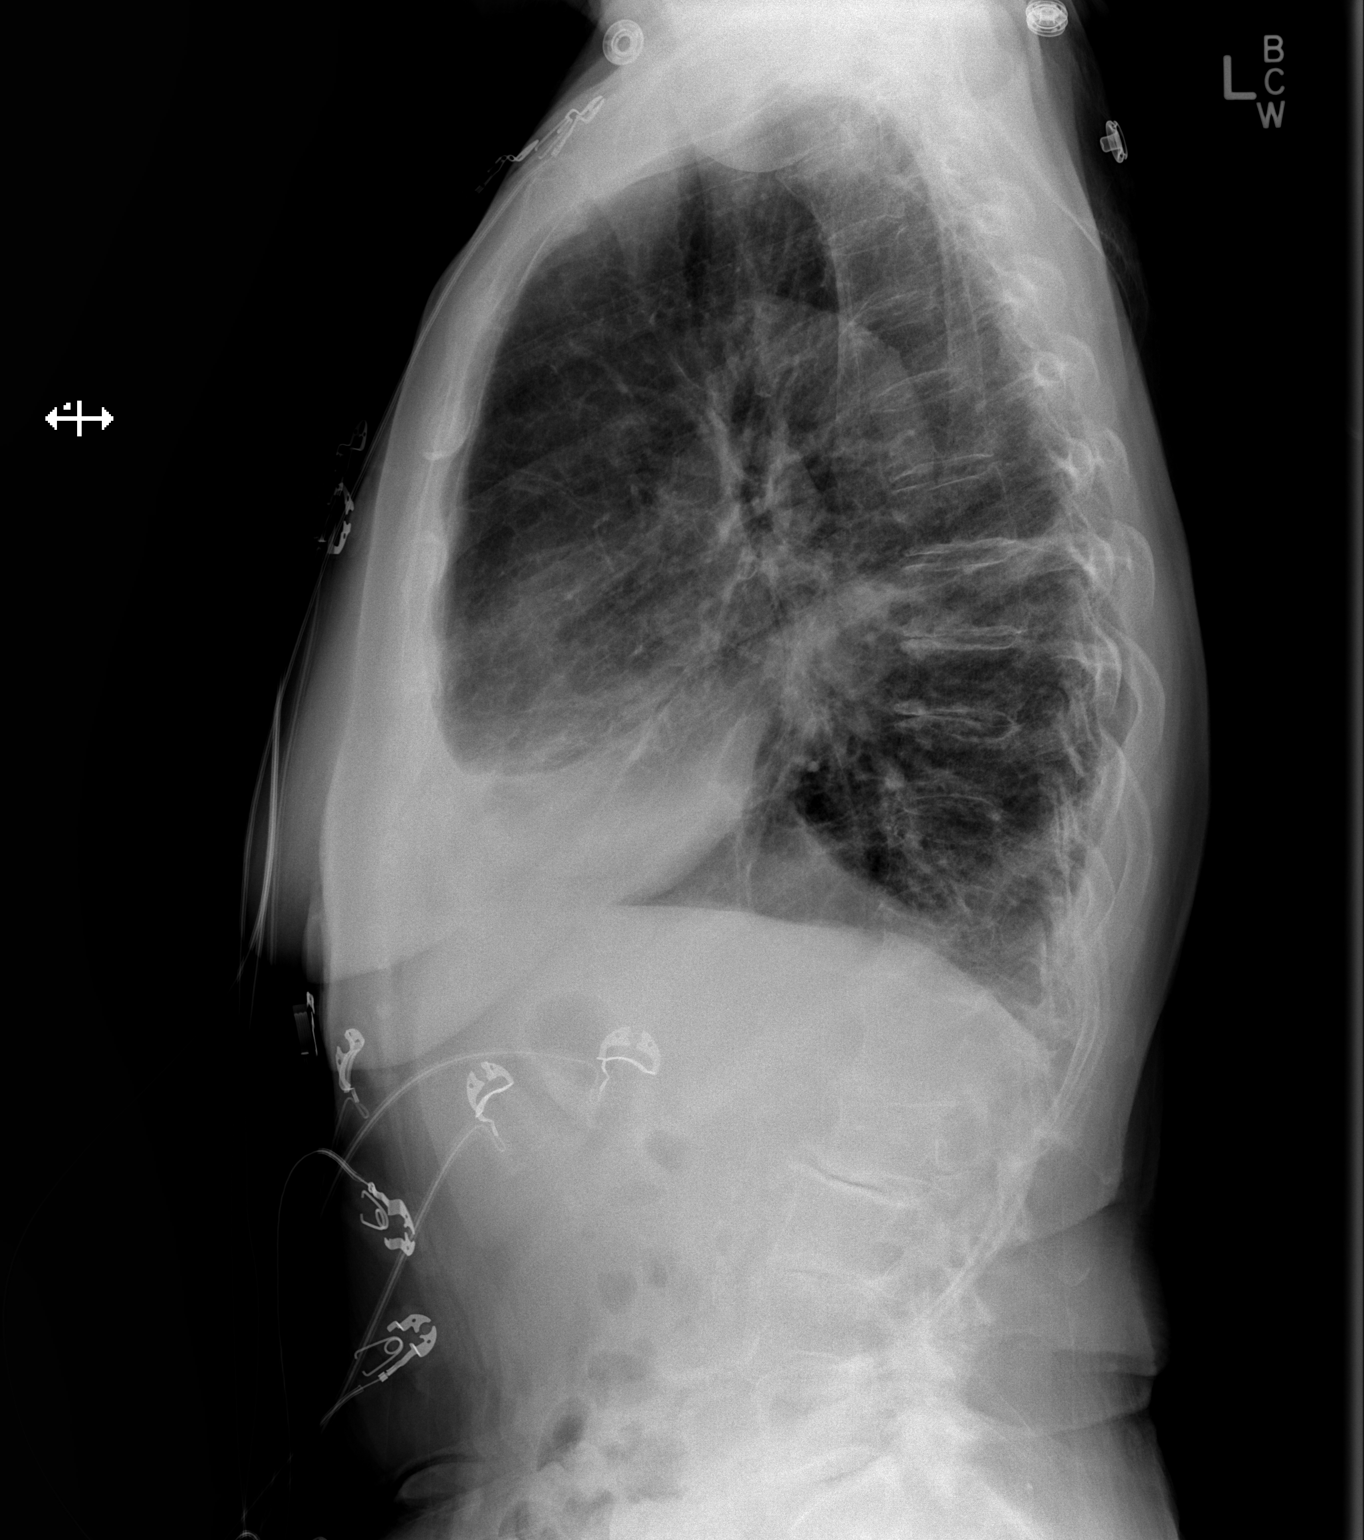

[2 of 2 positions shown; findings below may reference images not displayed]

FINDINGS: Cardiac shadow is stable. A scoliosis is again identified of the
thoracolumbar spine. Increased density is again noted in the right
lower lobe similar to that seen on the recent chest CT examination
consistent with underlying mass and postobstructive change. A
right-sided effusion is noted.
IMPRESSION: Persistent right lower lobe mass lesion with associated effusion and
obstructive pneumonia.
